# Patient Record
Sex: Female | Born: 1935 | Race: White | Hispanic: No | Smoking: Former smoker
Health system: Southern US, Community
[De-identification: ages and names within clinical notes are randomized; demographics above are authoritative.]

## PROBLEM LIST (undated history)

## (undated) DIAGNOSIS — C801 Malignant (primary) neoplasm, unspecified: Secondary | ICD-10-CM

## (undated) DIAGNOSIS — I499 Cardiac arrhythmia, unspecified: Secondary | ICD-10-CM

## (undated) DIAGNOSIS — I10 Essential (primary) hypertension: Secondary | ICD-10-CM

## (undated) DIAGNOSIS — M1990 Unspecified osteoarthritis, unspecified site: Secondary | ICD-10-CM

## (undated) DIAGNOSIS — K5792 Diverticulitis of intestine, part unspecified, without perforation or abscess without bleeding: Secondary | ICD-10-CM

## (undated) DIAGNOSIS — R0602 Shortness of breath: Secondary | ICD-10-CM

## (undated) DIAGNOSIS — Z8719 Personal history of other diseases of the digestive system: Secondary | ICD-10-CM

## (undated) HISTORY — PX: APPENDECTOMY: SHX54

## (undated) HISTORY — PX: EYE SURGERY: SHX253

## (undated) HISTORY — PX: COLON SURGERY: SHX602

## (undated) HISTORY — PX: COLOSTOMY: SHX63

---

## 2014-08-10 NOTE — Pre-Procedure Instructions (Signed)
[name]  08/10/2014   Your procedure is scheduled on: Friday, August 17, 2014  Report to [facility] Admitting at [place] AM.  Call this number if you have problems the morning of surgery: [phone]   Remember:   Do not eat food or drink liquids after midnight Thursday, Sept. 17, 2015   Take these medicines the morning of surgery with A SIP OF WATER: Metoprolol and Tramadol if needed for pain Stop taking Aspirin,vitamins, and herbal medications. Do not take any NSAIDs ie: Ibuprofen, Advil, Naproxen or any medication containing Aspirin; stop now.  Do not wear jewelry, make-up or nail polish.  Do not wear lotions, powders, or perfumes. You may not wear deodorant.  Do not shave 48 hours prior to surgery.  Do not bring valuables to the hospital.  [facility] is not responsible for any belongings or valuables.               Contacts, dentures or bridgework may not be worn into surgery.  Leave suitcase in the car. After surgery it may be brought to your room.  For patients admitted to the hospital, discharge time is determined by your treatment team.               Patients discharged the day of surgery will not be allowed to drive home.  Name and phone number of your driver:   Special Instructions:  Special Instructions:Special Instructions: [facility] - Preparing for Surgery  Before surgery, you can play an important role.  Because skin is not sterile, your skin needs to be as free of germs as possible.  You can reduce the number of germs on you skin by washing with CHG (chlorahexidine gluconate) soap before surgery.  CHG is an antiseptic cleaner which kills germs and bonds with the skin to continue killing germs even after washing.  Please DO NOT use if you have an allergy to CHG or antibacterial soaps.  If your skin becomes reddened/irritated stop using the CHG and inform your nurse when you arrive at Short Stay.  Do not shave (including legs and underarms) for at least  48 hours prior to the first CHG shower.  You may shave your face.  Please follow these instructions carefully:   1.  Shower with CHG Soap the night before surgery and the morning of Surgery.  2.  If you choose to wash your hair, wash your hair first as usual with your normal shampoo.  3.  After you shampoo, rinse your hair and body thoroughly to remove the Shampoo.  4.  Use CHG as you would any other liquid soap.  You can apply chg directly  to the skin and wash gently with scrungie or a clean washcloth.  5.  Apply the CHG Soap to your body ONLY FROM THE NECK DOWN.  Do not use on open wounds or open sores.  Avoid contact with your eyes, ears, mouth and genitals (private parts).  Wash genitals (private parts) with your normal soap.  6.  Wash thoroughly, paying special attention to the area where your surgery will be performed.  7.  Thoroughly rinse your body with warm water from the neck down.  8.  DO NOT shower/wash with your normal soap after using and rinsing off the CHG Soap.  9.  Pat yourself dry with a clean towel.            10.  Wear clean pajamas.  11.  Place clean sheets on your bed the night of your first shower and do not sleep with pets.  Day of Surgery  Do not apply any lotions/deoderants the morning of surgery.  Please wear clean clothes to the hospital/surgery center.   Please read over the following fact sheets that you were given: Pain Booklet, Coughing and Deep Breathing and Surgical Site Infection Prevention

## 2014-08-13 ENCOUNTER — Encounter (HOSPITAL_COMMUNITY)
Admission: RE | Admit: 2014-08-13 | Discharge: 2014-08-13 | Disposition: A | Discharge status: Home / Self Care | Payer: Medicare PPO | Source: Ambulatory Visit | Attending: Anesthesiology | Admitting: Anesthesiology

## 2014-08-13 ENCOUNTER — Encounter (HOSPITAL_COMMUNITY)
Admission: RE | Admit: 2014-08-13 | Discharge: 2014-08-13 | Disposition: A | Discharge status: Home / Self Care | Payer: Medicare PPO | Source: Ambulatory Visit | Attending: Orthopedic Surgery | Admitting: Orthopedic Surgery

## 2014-08-13 ENCOUNTER — Encounter (HOSPITAL_COMMUNITY): Payer: Self-pay

## 2014-08-13 DIAGNOSIS — M719 Bursopathy, unspecified: Secondary | ICD-10-CM | POA: Diagnosis present

## 2014-08-13 DIAGNOSIS — Z01812 Encounter for preprocedural laboratory examination: Secondary | ICD-10-CM | POA: Diagnosis not present

## 2014-08-13 DIAGNOSIS — Z01810 Encounter for preprocedural cardiovascular examination: Secondary | ICD-10-CM | POA: Diagnosis not present

## 2014-08-13 DIAGNOSIS — M67919 Unspecified disorder of synovium and tendon, unspecified shoulder: Secondary | ICD-10-CM | POA: Insufficient documentation

## 2014-08-13 DIAGNOSIS — M19019 Primary osteoarthritis, unspecified shoulder: Secondary | ICD-10-CM | POA: Insufficient documentation

## 2014-08-13 HISTORY — DX: Malignant (primary) neoplasm, unspecified: C80.1

## 2014-08-13 HISTORY — DX: Diverticulitis of intestine, part unspecified, without perforation or abscess without bleeding: K57.92

## 2014-08-13 HISTORY — DX: Essential (primary) hypertension: I10

## 2014-08-13 HISTORY — DX: Unspecified osteoarthritis, unspecified site: M19.90

## 2014-08-13 HISTORY — DX: Personal history of other diseases of the digestive system: Z87.19

## 2014-08-13 HISTORY — DX: Cardiac arrhythmia, unspecified: I49.9

## 2014-08-13 HISTORY — DX: Shortness of breath: R06.02

## 2014-08-13 LAB — CBC
HCT: 33.2 % — ABNORMAL LOW (ref 36.0–46.0)
Hemoglobin: 11.2 g/dL — ABNORMAL LOW (ref 12.0–15.0)
MCH: 30.1 pg (ref 26.0–34.0)
MCHC: 33.7 g/dL (ref 30.0–36.0)
MCV: 89.2 fL (ref 78.0–100.0)
PLATELETS: 198 10*3/uL (ref 150–400)
RBC: 3.72 MIL/uL — AB (ref 3.87–5.11)
RDW: 12.8 % (ref 11.5–15.5)
WBC: 5.3 10*3/uL (ref 4.0–10.5)

## 2014-08-13 LAB — BASIC METABOLIC PANEL
ANION GAP: 14 (ref 5–15)
BUN: 12 mg/dL (ref 6–23)
CALCIUM: 8.6 mg/dL (ref 8.4–10.5)
CO2: 23 mEq/L (ref 19–32)
Chloride: 101 mEq/L (ref 96–112)
Creatinine, Ser: 0.9 mg/dL (ref 0.50–1.10)
GFR calc Af Amer: 69 mL/min — ABNORMAL LOW (ref >90)
GFR, EST NON AFRICAN AMERICAN: 59 mL/min — AB (ref >90)
Glucose, Bld: 106 mg/dL — ABNORMAL HIGH (ref 70–99)
Potassium: 3.6 mEq/L — ABNORMAL LOW (ref 3.7–5.3)
SODIUM: 138 meq/L (ref 137–147)

## 2014-08-13 NOTE — Progress Notes (Signed)
Called Dr Norris's office for orders for patient's PAT appointment

## 2014-08-14 NOTE — Progress Notes (Addendum)
Anesthesia Chart Review:  Patient is a 80 year old female scheduled for reverse right shoulder arthroplasty on 08/17/14 by Dr. Veverly Taylor.  History includes former smoker, hiatal hernia, arthritis, exertional dyspnea, lymphoma RLE (Dr. Rhona Taylor), dysrhythmia (not specified), diverticulitis s/p colon resection with colostomy s/p takedown, appendectomy. PCP is Dr. Vassie Loll Taylor who signed a note of medical clearance recommending post-operative telemetry.  EKG on 08/13/14 showed: NSR, cannot rule out anterior infarct (age undetermined). Currently, there are no comparison EKGs available.  EKG at [facility] from 2007 was purged.  No stress or echo without three years at [facility].  She reports what sounds like brief perioperative afib with cataract surgery a few years ago in [place].  She did not have to stay overnight, but she was referred to a cardiologist in [place] and reportedly everything checked out okay.  She gets occasional positional lightheadedness with standing, but no syncope, no chest pain, no SOB, no palpitations.  She has chronic LE edema. She has occasional exertional dyspnea when walking up an incline to her daughter trailer that is behind her house.  She says this has been stable for a while now.    CXR on 08/13/14 showed: FINDINGS: There is underlying emphysematous change. There is chronic interstitial fibrotic change in the upper lobes bilaterally and in the left base regions. There is no frank edema or consolidation. The heart size is normal. The pulmonary vascularity reflects underlying emphysematous change. No adenopathy. There is a hiatal hernia present. Aorta is somewhat tortuous. There are multiple compression fractures in the lower thoracic spine. The patient has undergone kyphoplasty procedures at T11 and T12. IMPRESSION: Underlying emphysema with areas of interstitial fibrotic change. No frank edema or consolidation. Moderate hiatal hernia. (She apparently had a chest CT  on 11/07/13, but I cannot currently access that report.  She thought it was done at [facility], but no report found there.)  Preoperative labs noted.   I am awaiting records from [facility]) in [place]. I did review CXR with anesthesiologist Dr. Tamala Taylor and with patient.  She doesn't report any acute pulmonary symptoms, so CXR findings should not interfere with plans for surgery; however, we'll plan to forward report to Dr. Nona Taylor and defer decision for further testing and/or referral to him.    I'll review additional records once received.  [name] [facility] Short Stay Center/Anesthesiology Phone [phone] 08/14/2014 5:58 PM  Addendum:  I called CXR report to nurse [name] with Dr. Nona Taylor.  She did not see any record of a 2014 chest CT.  She said that it looked like patient was seen by cardiology in 2014.  Records are still pending.  [name] [facility] Short Stay Center/Anesthesiology Phone [phone] 08/15/2014 3:02 PM  Addendum: I received cardiology notes from [name] from 12/05/2013.  She was referred for afib/PAF.  According to his note, she a "a longstanding history of intermittent rapid heart rhythm, not severe or sustained.  She had an episode when she was in Dr. Doran Taylor office and was documented to have atrial fibrillation with moderately rapid rate of 120 per minute.  She was put on metoprolol and has had no recurrence..." Due to absence of TIA and increased fall risk no anticoagulation therapy except ASA was recommended.  He recommended PRN cardiology follow-up.  Echo on 11/21/13 showed: Normal LVEF, impaired relaxation. Normal RV size and function. Thickened AV. Velocity across the Valve: 114cm/s. Mild mitral annular calcification. Mild TR.  TR velocity 196 cm/s consistent with normal PAP.   Nuclear stress test on 03/11/11 showed: No evidence of ischemia. Normal wall motion and thickening with EF 65%.    Of  note, anterior and inferior T wave abnormality has improved since her 12/05/13 EKG.  She remains on b-blocker therapy and was maintaining SR at PAT.  She has been cleared by her PCP. If no acute changes then I would anticipate that she could proceed as planned.  I'll update history in Epic to reflect PAF history.  [name] [facility] Short Stay Center/Anesthesiology Phone [phone] 08/16/2014 10:04 AM

## 2014-08-14 NOTE — H&P (Signed)
[name] is an 80 y.o. female.    Chief Complaint: right shoulder pain  HPI: Pt is a 80 y.o. female complaining of right shoulder pain for multiple years. Pain had continually increased since the beginning. X-rays in the clinic show end-stage arthritic changes of the right shoulder. Pt has tried various conservative treatments which have failed to alleviate their symptoms, including therapy and injections. Various options are discussed with the patient. Risks, benefits and expectations were discussed with the patient. Patient understand the risks, benefits and expectations and wishes to proceed with surgery.   PCP:  Townsend Roger, MD  D/C Plans:  Home with HHPT  PMH: Past Medical History  Diagnosis Date  . Dysrhythmia   . Hypertension   . Cancer     lymphoma right leg Dr Earl Gala  . H/O hiatal hernia   . Arthritis   . Diverticulitis   . Shortness of breath     with exertion, get dizzy with deep breathing    PSH: Past Surgical History  Procedure Laterality Date  . Colostomy      due to diverticlitis had colostomy for 1 year  . Colon surgery    . Eye surgery Bilateral     cataracts  . Appendectomy      Social History:  reports that she quit smoking about 59 years ago. Her smoking use included Cigarettes. She smoked 0.00 packs per day. She has never used smokeless tobacco. She reports that she does not drink alcohol or use illicit drugs.  Allergies:  Allergies  Allergen Reactions  . Penicillins Anaphylaxis    Medications: No current facility-administered medications for this encounter.   Current Outpatient Prescriptions  Medication Sig Dispense Refill  . aspirin 81 MG tablet Take 81 mg by mouth daily.      Marland Kitchen CALCIUM-VITAMIN D PO Take by mouth.      . cyclobenzaprine (FLEXERIL) 5 MG tablet Take 5 mg by mouth at bedtime as needed for muscle spasms.      Marland Kitchen ibuprofen (ADVIL,MOTRIN) 200 MG tablet Take 400 mg by mouth daily as needed (pain).      . metoprolol  tartrate (LOPRESSOR) 25 MG tablet Take 12.5 mg by mouth 2 (two) times daily.      . Omega-3 Fatty Acids (FISH OIL PO) Take by mouth.      . traMADol (ULTRAM) 50 MG tablet Take 50 mg by mouth every 4 (four) hours as needed (pain).        Results for orders placed during the hospital encounter of 08/13/14 (from the past 48 hour(s))  BASIC METABOLIC PANEL     Status: Abnormal   Collection Time    08/13/14 10:59 AM      Result Value Ref Range   Sodium 138  137 - 147 mEq/L   Potassium 3.6 (*) 3.7 - 5.3 mEq/L   Chloride 101  96 - 112 mEq/L   CO2 23  19 - 32 mEq/L   Glucose, Bld 106 (*) 70 - 99 mg/dL   BUN 12  6 - 23 mg/dL   Creatinine, Ser 0.90  0.50 - 1.10 mg/dL   Calcium 8.6  8.4 - 10.5 mg/dL   GFR calc non Af Amer 59 (*) >90 mL/min   GFR calc Af Amer 69 (*) >90 mL/min   Comment: (NOTE)     The eGFR has been calculated using the CKD EPI equation.     This calculation has not been validated in all clinical situations.  eGFR's persistently <90 mL/min signify possible Chronic Kidney     Disease.   Anion gap 14  5 - 15  CBC     Status: Abnormal   Collection Time    08/13/14 10:59 AM      Result Value Ref Range   WBC 5.3  4.0 - 10.5 K/uL   RBC 3.72 (*) 3.87 - 5.11 MIL/uL   Hemoglobin 11.2 (*) 12.0 - 15.0 g/dL   HCT 33.2 (*) 36.0 - 46.0 %   MCV 89.2  78.0 - 100.0 fL   MCH 30.1  26.0 - 34.0 pg   MCHC 33.7  30.0 - 36.0 g/dL   RDW 12.8  11.5 - 15.5 %   Platelets 198  150 - 400 K/uL   Dg Chest 2 View  08/13/2014   CLINICAL DATA:  Preoperative shoulder surgery ; shortness of breath ; hypertension  EXAM: CHEST  2 VIEW  COMPARISON:  Chest radiograph August 17, 2011 and chest CT November 07, 2013  FINDINGS: There is underlying emphysematous change. There is chronic interstitial fibrotic change in the upper lobes bilaterally and in the left base regions. There is no frank edema or consolidation. The heart size is normal. The pulmonary vascularity reflects underlying emphysematous change.  No adenopathy. There is a hiatal hernia present. Aorta is somewhat tortuous. There are multiple compression fractures in the lower thoracic spine. The patient has undergone kyphoplasty procedures at T11 and T12.  IMPRESSION: Underlying emphysema with areas of interstitial fibrotic change. No frank edema or consolidation. Moderate hiatal hernia.   Electronically Signed   By: Lowella Grip M.D.   On: 08/13/2014 12:46    ROS: Pain with rom of the right upper extremity  Physical Exam:  Alert and oriented 80 y.o. female in no acute distress Cranial nerves 2-12 intact Cervical spine: full rom with no tenderness, nv intact distally Chest: active breath sounds bilaterally, no wheeze rhonchi or rales Heart: regular rate and rhythm, no murmur Abd: non tender non distended with active bowel sounds Hip is stable with rom  Right shoulder/upper extremity with limited rom due to pain and guarding nv intact distally Strength limited 3.5/5 with ER and IR No rashes  Assessment/Plan Assessment: right shoulder pain secondary to rotator cuff insufficiency  Plan: Patient will undergo a right reverse total shoulder by Dr. Veverly Fells at [facility]. Risks benefits and expectations were discussed with the patient. Patient understand risks, benefits and expectations and wishes to proceed.

## 2014-08-16 ENCOUNTER — Encounter (HOSPITAL_COMMUNITY): Payer: Self-pay

## 2014-08-16 MED ORDER — CLINDAMYCIN PHOSPHATE 900 MG/50ML IV SOLN
900.0000 mg | INTRAVENOUS | Status: DC
  Filled 2014-08-16: qty 50

## 2014-08-17 ENCOUNTER — Inpatient Hospital Stay (HOSPITAL_COMMUNITY): Payer: Medicare PPO

## 2014-08-17 ENCOUNTER — Encounter (HOSPITAL_COMMUNITY)
Admission: RE | Disposition: A | Discharge status: Home / Self Care | Payer: Self-pay | Source: Ambulatory Visit | Attending: Orthopedic Surgery

## 2014-08-17 ENCOUNTER — Inpatient Hospital Stay (HOSPITAL_COMMUNITY)
Admission: RE | Admit: 2014-08-17 | Discharge: 2014-08-18 | DRG: 483 | Disposition: A | Discharge status: Home / Self Care | Payer: Medicare PPO | Source: Ambulatory Visit | Attending: Orthopedic Surgery | Admitting: Orthopedic Surgery

## 2014-08-17 ENCOUNTER — Encounter (HOSPITAL_COMMUNITY): Payer: Medicare PPO | Admitting: Vascular Surgery

## 2014-08-17 ENCOUNTER — Inpatient Hospital Stay (HOSPITAL_COMMUNITY): Payer: Medicare PPO | Admitting: Anesthesiology

## 2014-08-17 ENCOUNTER — Encounter (HOSPITAL_COMMUNITY): Payer: Self-pay | Admitting: Anesthesiology

## 2014-08-17 DIAGNOSIS — Z880 Allergy status to penicillin: Secondary | ICD-10-CM | POA: Diagnosis not present

## 2014-08-17 DIAGNOSIS — S43429A Sprain of unspecified rotator cuff capsule, initial encounter: Secondary | ICD-10-CM | POA: Diagnosis present

## 2014-08-17 DIAGNOSIS — Z7982 Long term (current) use of aspirin: Secondary | ICD-10-CM | POA: Diagnosis not present

## 2014-08-17 DIAGNOSIS — K449 Diaphragmatic hernia without obstruction or gangrene: Secondary | ICD-10-CM | POA: Diagnosis present

## 2014-08-17 DIAGNOSIS — M25519 Pain in unspecified shoulder: Secondary | ICD-10-CM | POA: Diagnosis present

## 2014-08-17 DIAGNOSIS — Z87891 Personal history of nicotine dependence: Secondary | ICD-10-CM

## 2014-08-17 DIAGNOSIS — I10 Essential (primary) hypertension: Secondary | ICD-10-CM | POA: Diagnosis present

## 2014-08-17 DIAGNOSIS — C8589 Other specified types of non-Hodgkin lymphoma, extranodal and solid organ sites: Secondary | ICD-10-CM | POA: Diagnosis present

## 2014-08-17 DIAGNOSIS — M19019 Primary osteoarthritis, unspecified shoulder: Secondary | ICD-10-CM | POA: Diagnosis present

## 2014-08-17 DIAGNOSIS — I4891 Unspecified atrial fibrillation: Secondary | ICD-10-CM | POA: Diagnosis present

## 2014-08-17 HISTORY — PX: REVERSE SHOULDER ARTHROPLASTY: SHX5054

## 2014-08-17 LAB — TYPE AND SCREEN
ABO/RH(D): O POS
Antibody Screen: NEGATIVE

## 2014-08-17 LAB — ABO/RH: ABO/RH(D): O POS

## 2014-08-17 SURGERY — ARTHROPLASTY, SHOULDER, TOTAL, REVERSE
Anesthesia: Regional | Site: Shoulder | Laterality: Right

## 2014-08-17 MED ORDER — BUPIVACAINE-EPINEPHRINE (PF) 0.5% -1:200000 IJ SOLN
INTRAMUSCULAR | Status: AC
  Filled 2014-08-17: qty 30

## 2014-08-17 MED ORDER — ONDANSETRON HCL 4 MG/2ML IJ SOLN
INTRAMUSCULAR | Status: DC | PRN
  Administered 2014-08-17: 4 mg via INTRAVENOUS

## 2014-08-17 MED ORDER — PROPOFOL 10 MG/ML IV BOLUS
INTRAVENOUS | Status: AC
  Filled 2014-08-17: qty 20

## 2014-08-17 MED ORDER — TRAMADOL HCL 50 MG PO TABS
50.0000 mg | ORAL_TABLET | ORAL | Status: DC | PRN
  Administered 2014-08-17: 50 mg via ORAL
  Filled 2014-08-17: qty 1

## 2014-08-17 MED ORDER — PROPOFOL 10 MG/ML IV BOLUS
INTRAVENOUS | Status: DC | PRN
  Administered 2014-08-17: 120 mg via INTRAVENOUS

## 2014-08-17 MED ORDER — OXYCODONE HCL 5 MG PO TABS: 5.0000 mg | ORAL_TABLET | Freq: Once | ORAL | Status: DC | PRN

## 2014-08-17 MED ORDER — CLINDAMYCIN PHOSPHATE 600 MG/50ML IV SOLN
600.0000 mg | Freq: Four times a day (QID) | INTRAVENOUS | Status: AC
  Administered 2014-08-17 – 2014-08-18 (×3): 600 mg via INTRAVENOUS
  Filled 2014-08-17 (×3): qty 50

## 2014-08-17 MED ORDER — BUPIVACAINE-EPINEPHRINE 0.25% -1:200000 IJ SOLN
INTRAMUSCULAR | Status: DC | PRN
  Administered 2014-08-17: 5 mL

## 2014-08-17 MED ORDER — ONDANSETRON HCL 4 MG/2ML IJ SOLN
INTRAMUSCULAR | Status: AC
  Filled 2014-08-17: qty 2

## 2014-08-17 MED ORDER — PHENYLEPHRINE 40 MCG/ML (10ML) SYRINGE FOR IV PUSH (FOR BLOOD PRESSURE SUPPORT)
PREFILLED_SYRINGE | INTRAVENOUS | Status: AC
  Filled 2014-08-17: qty 10

## 2014-08-17 MED ORDER — FENTANYL CITRATE 0.05 MG/ML IJ SOLN
50.0000 ug | Freq: Once | INTRAMUSCULAR | Status: AC
  Administered 2014-08-17: 100 ug via INTRAVENOUS

## 2014-08-17 MED ORDER — MENTHOL 3 MG MT LOZG: 1.0000 | LOZENGE | OROMUCOSAL | Status: DC | PRN

## 2014-08-17 MED ORDER — SODIUM CHLORIDE 0.9 % IJ SOLN
INTRAMUSCULAR | Status: AC
  Filled 2014-08-17: qty 10

## 2014-08-17 MED ORDER — DEXAMETHASONE SODIUM PHOSPHATE 4 MG/ML IJ SOLN
INTRAMUSCULAR | Status: DC | PRN
  Administered 2014-08-17: 4 mg via INTRAVENOUS

## 2014-08-17 MED ORDER — PHENYLEPHRINE HCL 10 MG/ML IJ SOLN
INTRAMUSCULAR | Status: DC | PRN
  Administered 2014-08-17 (×12): 40 ug via INTRAVENOUS

## 2014-08-17 MED ORDER — SODIUM CHLORIDE 0.9 % IR SOLN
Status: DC | PRN
Start: 2014-08-17 — End: 2014-08-17
  Administered 2014-08-17: 1000 mL

## 2014-08-17 MED ORDER — METHOCARBAMOL 500 MG PO TABS
500.0000 mg | ORAL_TABLET | Freq: Four times a day (QID) | ORAL | Status: DC | PRN
  Administered 2014-08-18: 500 mg via ORAL
  Filled 2014-08-17 (×2): qty 1

## 2014-08-17 MED ORDER — ONDANSETRON HCL 4 MG/2ML IJ SOLN: 4.0000 mg | Freq: Four times a day (QID) | INTRAMUSCULAR | Status: DC | PRN

## 2014-08-17 MED ORDER — ACETAMINOPHEN 325 MG PO TABS: 650.0000 mg | ORAL_TABLET | Freq: Four times a day (QID) | ORAL | Status: DC | PRN

## 2014-08-17 MED ORDER — METOCLOPRAMIDE HCL 5 MG/ML IJ SOLN: 5.0000 mg | Freq: Three times a day (TID) | INTRAMUSCULAR | Status: DC | PRN

## 2014-08-17 MED ORDER — BUPIVACAINE-EPINEPHRINE (PF) 0.5% -1:200000 IJ SOLN
INTRAMUSCULAR | Status: DC | PRN
  Administered 2014-08-17: 30 mL via PERINEURAL

## 2014-08-17 MED ORDER — ASPIRIN 81 MG PO CHEW
81.0000 mg | CHEWABLE_TABLET | Freq: Every day | ORAL | Status: DC
  Administered 2014-08-18: 81 mg via ORAL
  Filled 2014-08-17: qty 1

## 2014-08-17 MED ORDER — METOCLOPRAMIDE HCL 10 MG PO TABS: 5.0000 mg | ORAL_TABLET | Freq: Three times a day (TID) | ORAL | Status: DC | PRN

## 2014-08-17 MED ORDER — FENTANYL CITRATE 0.05 MG/ML IJ SOLN
INTRAMUSCULAR | Status: AC
  Administered 2014-08-17: 100 ug via INTRAVENOUS
  Filled 2014-08-17: qty 2

## 2014-08-17 MED ORDER — FENTANYL CITRATE 0.05 MG/ML IJ SOLN
INTRAMUSCULAR | Status: DC | PRN
  Administered 2014-08-17: 25 ug via INTRAVENOUS

## 2014-08-17 MED ORDER — ROCURONIUM BROMIDE 100 MG/10ML IV SOLN
INTRAVENOUS | Status: DC | PRN
  Administered 2014-08-17: 30 mg via INTRAVENOUS

## 2014-08-17 MED ORDER — LACTATED RINGERS IV SOLN
INTRAVENOUS | Status: DC | PRN
  Administered 2014-08-17 (×2): via INTRAVENOUS

## 2014-08-17 MED ORDER — EPHEDRINE SULFATE 50 MG/ML IJ SOLN
INTRAMUSCULAR | Status: AC
  Filled 2014-08-17: qty 1

## 2014-08-17 MED ORDER — SODIUM CHLORIDE 0.9 % IV SOLN
INTRAVENOUS | Status: DC
Start: 2014-08-17 — End: 2014-08-18

## 2014-08-17 MED ORDER — STERILE WATER FOR IRRIGATION IR SOLN
Status: DC | PRN
  Administered 2014-08-17: 1000 mL

## 2014-08-17 MED ORDER — CYCLOBENZAPRINE HCL 10 MG PO TABS
5.0000 mg | ORAL_TABLET | Freq: Every evening | ORAL | Status: DC | PRN
  Administered 2014-08-17: 5 mg via ORAL
  Filled 2014-08-17: qty 1

## 2014-08-17 MED ORDER — CHLORHEXIDINE GLUCONATE 4 % EX LIQD: 60.0000 mL | Freq: Once | CUTANEOUS | Status: DC

## 2014-08-17 MED ORDER — METOPROLOL TARTRATE 12.5 MG HALF TABLET
12.5000 mg | ORAL_TABLET | Freq: Two times a day (BID) | ORAL | Status: DC
  Administered 2014-08-17 – 2014-08-18 (×2): 12.5 mg via ORAL
  Filled 2014-08-17 (×4): qty 1

## 2014-08-17 MED ORDER — ROCURONIUM BROMIDE 50 MG/5ML IV SOLN
INTRAVENOUS | Status: AC
  Filled 2014-08-17: qty 1

## 2014-08-17 MED ORDER — OXYCODONE-ACETAMINOPHEN 5-325 MG PO TABS: 1.0000 | ORAL_TABLET | ORAL | Status: DC | PRN

## 2014-08-17 MED ORDER — SUCCINYLCHOLINE CHLORIDE 20 MG/ML IJ SOLN
INTRAMUSCULAR | Status: AC
  Filled 2014-08-17: qty 1

## 2014-08-17 MED ORDER — NEOSTIGMINE METHYLSULFATE 10 MG/10ML IV SOLN
INTRAVENOUS | Status: DC | PRN
  Administered 2014-08-17: 3 mg via INTRAVENOUS

## 2014-08-17 MED ORDER — ACETAMINOPHEN 650 MG RE SUPP: 650.0000 mg | Freq: Four times a day (QID) | RECTAL | Status: DC | PRN

## 2014-08-17 MED ORDER — METHOCARBAMOL 1000 MG/10ML IJ SOLN
500.0000 mg | Freq: Four times a day (QID) | INTRAMUSCULAR | Status: DC | PRN
  Filled 2014-08-17: qty 5

## 2014-08-17 MED ORDER — ONDANSETRON HCL 4 MG PO TABS: 4.0000 mg | ORAL_TABLET | Freq: Four times a day (QID) | ORAL | Status: DC | PRN

## 2014-08-17 MED ORDER — ARTIFICIAL TEARS OP OINT
TOPICAL_OINTMENT | OPHTHALMIC | Status: AC
  Filled 2014-08-17: qty 3.5

## 2014-08-17 MED ORDER — PHENOL 1.4 % MT LIQD: 1.0000 | OROMUCOSAL | Status: DC | PRN

## 2014-08-17 MED ORDER — GLYCOPYRROLATE 0.2 MG/ML IJ SOLN
INTRAMUSCULAR | Status: DC | PRN
  Administered 2014-08-17: 0.4 mg via INTRAVENOUS

## 2014-08-17 MED ORDER — GLYCOPYRROLATE 0.2 MG/ML IJ SOLN
INTRAMUSCULAR | Status: AC
  Filled 2014-08-17: qty 2

## 2014-08-17 MED ORDER — MORPHINE SULFATE 2 MG/ML IJ SOLN: 2.0000 mg | INTRAMUSCULAR | Status: DC | PRN

## 2014-08-17 MED ORDER — NEOSTIGMINE METHYLSULFATE 10 MG/10ML IV SOLN
INTRAVENOUS | Status: AC
  Filled 2014-08-17: qty 1

## 2014-08-17 MED ORDER — LIDOCAINE HCL (CARDIAC) 20 MG/ML IV SOLN
INTRAVENOUS | Status: DC | PRN
  Administered 2014-08-17: 60 mg via INTRAVENOUS

## 2014-08-17 MED ORDER — METHOCARBAMOL 500 MG PO TABS: 500.0000 mg | ORAL_TABLET | Freq: Three times a day (TID) | ORAL | Status: DC | PRN

## 2014-08-17 MED ORDER — OXYCODONE HCL 5 MG/5ML PO SOLN: 5.0000 mg | Freq: Once | ORAL | Status: DC | PRN

## 2014-08-17 MED ORDER — LACTATED RINGERS IV SOLN
INTRAVENOUS | Status: DC
  Administered 2014-08-17: 09:00:00 via INTRAVENOUS

## 2014-08-17 MED ORDER — FENTANYL CITRATE 0.05 MG/ML IJ SOLN: 25.0000 ug | INTRAMUSCULAR | Status: DC | PRN

## 2014-08-17 MED ORDER — OXYCODONE-ACETAMINOPHEN 5-325 MG PO TABS
1.0000 | ORAL_TABLET | ORAL | Status: DC | PRN
  Administered 2014-08-18: 2 via ORAL
  Filled 2014-08-17: qty 2

## 2014-08-17 MED ORDER — FENTANYL CITRATE 0.05 MG/ML IJ SOLN
INTRAMUSCULAR | Status: AC
  Filled 2014-08-17: qty 5

## 2014-08-17 MED ORDER — ASPIRIN 81 MG PO TABS: 81.0000 mg | ORAL_TABLET | Freq: Every day | ORAL | Status: DC

## 2014-08-17 MED ORDER — CLINDAMYCIN PHOSPHATE 900 MG/50ML IV SOLN
INTRAVENOUS | Status: DC | PRN
  Administered 2014-08-17: 900 mg via INTRAVENOUS

## 2014-08-17 SURGICAL SUPPLY — 66 items
BIT DRILL 170X2.5X (BIT) IMPLANT
BIT DRL 170X2.5X (BIT)
BLADE SAG 18X100X1.27 (BLADE) ×3 IMPLANT
CAPT SHOULD DELTAXTEND CEM MOD ×3 IMPLANT
CLOSURE WOUND 1/2 X4 (GAUZE/BANDAGES/DRESSINGS) ×1
COVER SURGICAL LIGHT HANDLE (MISCELLANEOUS) ×3 IMPLANT
DRAPE INCISE IOBAN 66X45 STRL (DRAPES) ×3 IMPLANT
DRAPE U-SHAPE 47X51 STRL (DRAPES) ×3 IMPLANT
DRAPE X-RAY CASS 24X20 (DRAPES) IMPLANT
DRILL 2.5 (BIT)
DRSG ADAPTIC 3X8 NADH LF (GAUZE/BANDAGES/DRESSINGS) ×3 IMPLANT
DRSG PAD ABDOMINAL 8X10 ST (GAUZE/BANDAGES/DRESSINGS) ×3 IMPLANT
DURAPREP 26ML APPLICATOR (WOUND CARE) ×3 IMPLANT
ELECT BLADE 4.0 EZ CLEAN MEGAD (MISCELLANEOUS) ×3
ELECT NEEDLE TIP 2.8 STRL (NEEDLE) ×3 IMPLANT
ELECT REM PT RETURN 9FT ADLT (ELECTROSURGICAL) ×3
ELECTRODE BLDE 4.0 EZ CLN MEGD (MISCELLANEOUS) ×1 IMPLANT
ELECTRODE REM PT RTRN 9FT ADLT (ELECTROSURGICAL) ×1 IMPLANT
GAUZE SPONGE 4X4 12PLY STRL (GAUZE/BANDAGES/DRESSINGS) ×3 IMPLANT
GLOVE BIOGEL PI ORTHO PRO 7.5 (GLOVE) ×2
GLOVE BIOGEL PI ORTHO PRO SZ8 (GLOVE) ×2
GLOVE ORTHO TXT STRL SZ7.5 (GLOVE) ×3 IMPLANT
GLOVE PI ORTHO PRO STRL 7.5 (GLOVE) ×1 IMPLANT
GLOVE PI ORTHO PRO STRL SZ8 (GLOVE) ×1 IMPLANT
GLOVE SURG ORTHO 8.5 STRL (GLOVE) ×3 IMPLANT
GOWN STRL REUS W/ TWL LRG LVL3 (GOWN DISPOSABLE) ×1 IMPLANT
GOWN STRL REUS W/ TWL XL LVL3 (GOWN DISPOSABLE) ×2 IMPLANT
GOWN STRL REUS W/TWL LRG LVL3 (GOWN DISPOSABLE) ×2
GOWN STRL REUS W/TWL XL LVL3 (GOWN DISPOSABLE) ×4
HANDPIECE INTERPULSE COAX TIP (DISPOSABLE)
KIT BASIN OR (CUSTOM PROCEDURE TRAY) ×3 IMPLANT
KIT ROOM TURNOVER OR (KITS) ×3 IMPLANT
MANIFOLD NEPTUNE II (INSTRUMENTS) ×3 IMPLANT
NEEDLE 1/2 CIR MAYO (NEEDLE) ×3 IMPLANT
NEEDLE HYPO 25GX1X1/2 BEV (NEEDLE) ×3 IMPLANT
NS IRRIG 1000ML POUR BTL (IV SOLUTION) ×3 IMPLANT
PACK SHOULDER (CUSTOM PROCEDURE TRAY) ×3 IMPLANT
PAD ABD 8X10 STRL (GAUZE/BANDAGES/DRESSINGS) ×3 IMPLANT
PAD ARMBOARD 7.5X6 YLW CONV (MISCELLANEOUS) ×6 IMPLANT
PIN GUIDE 1.2 (PIN) IMPLANT
PIN GUIDE GLENOPHERE 1.5MX300M (PIN) IMPLANT
PIN METAGLENE 2.5 (PIN) IMPLANT
SET HNDPC FAN SPRY TIP SCT (DISPOSABLE) IMPLANT
SLING ARM LRG ADULT FOAM STRAP (SOFTGOODS) IMPLANT
SLING ARM MED ADULT FOAM STRAP (SOFTGOODS) ×3 IMPLANT
SPONGE GAUZE 4X4 12PLY STER LF (GAUZE/BANDAGES/DRESSINGS) ×3 IMPLANT
SPONGE LAP 18X18 X RAY DECT (DISPOSABLE) ×3 IMPLANT
SPONGE LAP 4X18 X RAY DECT (DISPOSABLE) ×3 IMPLANT
STRIP CLOSURE SKIN 1/2X4 (GAUZE/BANDAGES/DRESSINGS) ×2 IMPLANT
SUCTION FRAZIER TIP 10 FR DISP (SUCTIONS) ×3 IMPLANT
SUT FIBERWIRE #2 38 T-5 BLUE (SUTURE) ×6
SUT MNCRL AB 4-0 PS2 18 (SUTURE) ×3 IMPLANT
SUT VIC AB 0 CT1 27 (SUTURE) ×2
SUT VIC AB 0 CT1 27XBRD ANBCTR (SUTURE) ×1 IMPLANT
SUT VIC AB 2-0 CT1 27 (SUTURE) ×2
SUT VIC AB 2-0 CT1 TAPERPNT 27 (SUTURE) ×1 IMPLANT
SUT VICRYL 0 CT 1 36IN (SUTURE) ×3 IMPLANT
SUTURE FIBERWR #2 38 T-5 BLUE (SUTURE) ×2 IMPLANT
SYR CONTROL 10ML LL (SYRINGE) ×3 IMPLANT
TAPE CLOTH SURG 6X10 WHT LF (GAUZE/BANDAGES/DRESSINGS) ×3 IMPLANT
TOWEL OR 17X24 6PK STRL BLUE (TOWEL DISPOSABLE) ×3 IMPLANT
TOWEL OR 17X26 10 PK STRL BLUE (TOWEL DISPOSABLE) ×3 IMPLANT
TOWER CARTRIDGE SMART MIX (DISPOSABLE) IMPLANT
TRAY FOLEY CATH 16FRSI W/METER (SET/KITS/TRAYS/PACK) IMPLANT
WATER STERILE IRR 1000ML POUR (IV SOLUTION) ×3 IMPLANT
YANKAUER SUCT BULB TIP NO VENT (SUCTIONS) IMPLANT

## 2014-08-17 NOTE — Anesthesia Preprocedure Evaluation (Signed)
Anesthesia Evaluation  Patient identified by MRN, date of birth, ID band Patient awake    Reviewed: Allergy & Precautions, H&P , NPO status , Patient's Chart, lab work & pertinent test results  Airway Mallampati: II  Neck ROM: full    Dental   Pulmonary shortness of breath, former smoker,          Cardiovascular hypertension, + dysrhythmias Atrial Fibrillation  PAF   Neuro/Psych    GI/Hepatic hiatal hernia,   Endo/Other    Renal/GU      Musculoskeletal  (+) Arthritis -,   Abdominal   Peds  Hematology   Anesthesia Other Findings   Reproductive/Obstetrics                           Anesthesia Physical Anesthesia Plan  ASA: II  Anesthesia Plan: General and Regional   Post-op Pain Management: MAC Combined w/ Regional for Post-op pain   Induction: Intravenous  Airway Management Planned: Oral ETT  Additional Equipment:   Intra-op Plan:   Post-operative Plan: Extubation in OR  Informed Consent: I have reviewed the patients History and Physical, chart, labs and discussed the procedure including the risks, benefits and alternatives for the proposed anesthesia with the patient or authorized representative who has indicated his/her understanding and acceptance.     Plan Discussed with: CRNA, Anesthesiologist and Surgeon  Anesthesia Plan Comments:         Anesthesia Quick Evaluation

## 2014-08-17 NOTE — Anesthesia Procedure Notes (Signed)
Anesthesia Regional Block:  Interscalene brachial plexus block  Pre-Anesthetic Checklist: ,, timeout performed, Correct Patient, Correct Site, Correct Laterality, Correct Procedure, Correct Position, site marked, Risks and benefits discussed,  Surgical consent,  Pre-op evaluation,  At surgeon's request and post-op pain management  Laterality: Right  Prep: chloraprep       Needles:  Injection technique: Single-shot  Needle Type: Echogenic Stimulator Needle     Needle Length: 5cm 5 cm Needle Gauge: 22 and 22 G    Additional Needles:  Procedures: ultrasound guided (picture in chart) and nerve stimulator Interscalene brachial plexus block  Nerve Stimulator or Paresthesia:  Response: biceps flexion, 0.45 mA,   Additional Responses:   Narrative:  Start time: 08/17/2014 9:02 AM End time: 08/17/2014 9:16 AM Injection made incrementally with aspirations every 5 mL.  Performed by: Personally  Anesthesiologist: Dr Marcie Bal  Additional Notes: Functioning IV was confirmed and monitors were applied.  A 52mm 22ga Arrow echogenic stimulator needle was used. Sterile prep and drape,hand hygiene and sterile gloves were used.  Negative aspiration and negative test dose prior to incremental administration of local anesthetic. The patient tolerated the procedure well.  Ultrasound guidance: relevent anatomy identified, needle position confirmed, local anesthetic spread visualized around nerve(s), vascular puncture avoided.  Image printed for medical record.

## 2014-08-17 NOTE — Discharge Instructions (Signed)
Ice to the shoulder at all times. May remove the sling when you feel comfortable.  Use the arm as you can for daily activities  Minimize lifting pushing and pulling  Keep the incision clean and dry for one week, then ok to shower and get the wound wet.  Follow up in two weeks with Dr Veverly Fells [phone]

## 2014-08-17 NOTE — Plan of Care (Signed)
Problem: Consults Goal: Diagnosis- Total Joint Replacement Reverse Total Shoulder

## 2014-08-17 NOTE — Brief Op Note (Signed)
08/17/2014  11:23 AM  PATIENT:  [name]  80 y.o. female  PRE-OPERATIVE DIAGNOSIS:  right shoulder rotator cuff tear arthropathy  POST-OPERATIVE DIAGNOSIS:  right shoulder rotator cuff tear arthropathy  PROCEDURE:  Procedure(s): REVERSE RIGHT SHOULDER ARTHROPLASTY (Right) DePuy Delta Xtend  SURGEON:  Surgeon(s) and Role:    * Augustin Schooling, MD - Primary  PHYSICIAN ASSISTANT:   ASSISTANTS: Ventura Bruns, PA-C   ANESTHESIA:   regional and general  EBL:  Total I/O In: 1100 [I.V.:1100] Out: 150 [Blood:150]  BLOOD ADMINISTERED:none  DRAINS: none   LOCAL MEDICATIONS USED:  MARCAINE     SPECIMEN:  No Specimen  DISPOSITION OF SPECIMEN:  N/A  COUNTS:  YES  TOURNIQUET:  * No tourniquets in log *  DICTATION: .Other Dictation: Dictation Number [id number]  PLAN OF CARE: Admit to inpatient   PATIENT DISPOSITION:  PACU - hemodynamically stable.   Delay start of Pharmacological VTE agent (>24hrs) due to surgical blood loss or risk of bleeding: no

## 2014-08-17 NOTE — Progress Notes (Signed)
Pt to OR.

## 2014-08-17 NOTE — Transfer of Care (Signed)
Immediate Anesthesia Transfer of Care Note  Patient: [name]  Procedure(s) Performed: Procedure(s): REVERSE RIGHT SHOULDER ARTHROPLASTY (Right)  Patient Location: PACU  Anesthesia Type:GA combined with regional for post-op pain  Level of Consciousness: awake, alert  and oriented  Airway & Oxygen Therapy: Patient Spontanous Breathing and Patient connected to face mask oxygen  Post-op Assessment: Report given to PACU RN  Post vital signs: Reviewed and stable  Complications: No apparent anesthesia complications

## 2014-08-17 NOTE — Op Note (Signed)
[name], [name] NO.:  [id number]  MEDICAL RECORD NO.:  [id number]  LOCATION:  [facility]                         FACILITY:  [facility]  PHYSICIAN:  Doran Heater. Veverly Fells, M.D. DATE OF BIRTH:  05/04/1933  DATE OF PROCEDURE:  08/17/2014 DATE OF DISCHARGE:                              OPERATIVE REPORT   PREOPERATIVE DIAGNOSIS:  Right shoulder rotator cuff tear arthropathy.  POSTOPERATIVE DIAGNOSIS:  Right shoulder rotator cuff tear arthropathy.  PROCEDURE PERFORMED:  Right reverse total shoulder arthroplasty and DePuy Delta Xtend prosthesis.  ATTENDING SURGEON:  Doran Heater. Veverly Fells, M.D.  ASSISTANT:  Abbott Pao. Dixon, P.A. who was scrubbed the entire procedure and necessary for satisfactory completion of surgery.  ANESTHESIA:  General anesthesia was used plus interscalene block.  ESTIMATED BLOOD LOSS:  200 mL.  FLUID REPLACED:  1500 mL of crystalloid.  COUNTS:  Instrument counts were correct.  COMPLICATIONS:  There were no complications.  ANTIBIOTICS:  Perioperative antibiotics were given.  INDICATIONS:  The patient is a 80 year old female with worsening right shoulder pain and loss of function secondary to rotator cuff tear arthropathy.  The patient has failed all measures of conservative management.  Desires total shoulder arthroplasty, reverse to restore range of motion and function to her shoulder and eliminate pain. Informed consent obtained.  DESCRIPTION OF PROCEDURE:  After an adequate level of anesthesia was achieved, the patient was positioned in the modified beach chair position.  Right shoulder correctly identified.  Time-out was called. Sterile prep and drape of the shoulder and arm performed.  We entered the shoulder using standard deltopectoral incision, started at the coracoid process extending down to the anterior humeral shaft.  The cephalic vein was identified, taken laterally with the deltoid. Pectoralis taken medially.  The conjoined tendon was  identified and retracted.  There was no subscapularis present and actually the capsule was basically scarred into the conjoined tendon and that had to be gently dissected away, and we basically performed an anterior capsulectomy.  We also released the capsule off of the inferior humerus. There was no rotator cuff present superiorly and posteriorly as well. Once we exposed the humeral head and delivered the wound, we placed a reamer in the proximal humerus and reamed the intramedullary shaft up to size 12.  We then placed the metaphyseal resection guide off the 12 shaft diameter, and then we went ahead and reamed for the epi to right for the metaphysis and reamed down to the appropriate depth.  Next, we went ahead and introduced our trial component, which was going to be a press-fit 12 body with an epi to right metaphyseal modular component. Once that was impacted into place, we went ahead and reduced the shoulder posteriorly, performed a 360-degree glenoid labral removal and also capsular removal inferiorly such we see the glenoid well.  We then went ahead and removed the remaining cartilage off the glenoid with a Cobb elevator, used our guide to drill the center point for the metaglene preparation.  We then reamed for the metaglene and then drilled out the central peg hole, impacted the metaglene into position. We then went ahead and placed a 36 inferior screw and 30 up into the  coracoid base, and then then 18 nonlocked anteriorly, could not get 1 posteriorly due to potential for fracture posteriorly.  We had great purchase with the screws and then secured metaglene.  We then placed our 38 standard glenosphere into position.  Screwed had been place.  Checked to make sure the axillary nerve was out of the way, and we then went ahead and reduced the shoulder with a 38 +6 poly.  Next, we went ahead and ranged the shoulder.  We had nice tight shoulder, but did not think we could get a +9 in  place.  We then removed the trial components from the humerus, irrigated thoroughly humeral shaft and then used impaction grafting technique with available bone graft from the humeral head to impact the size 12 epi to right HA-coated humeral stem into position for the Delta reverse DePuy shoulder.  We then went ahead and selected the 38 +9 poly, impacted that in position, reduced the shoulder, and placed a soft tissue balancing and a nice tight conjoined, also no gapping with external rotation and negative sulcus.  We checked the axillary nerve, it was not under too much tension.  Thoroughly irrigated and repaired the deltopectoral interval with 0 Vicryl suture followed by 2-0 Vicryl subcutaneous closure and 4-0 Monocryl for skin.  Steri-Strips applied followed by sterile dressing and shoulder sling.  The patient tolerated the surgery well.     Doran Heater. Veverly Fells, M.D.     SRN/MEDQ  D:  08/17/2014  T:  08/17/2014  Job:  [id number]

## 2014-08-17 NOTE — Interval H&P Note (Signed)
History and Physical Interval Note:  08/17/2014 9:22 AM  [name]  has presented today for surgery, with the diagnosis of right shoulder rotator cuff tear arthropathy  The various methods of treatment have been discussed with the patient and family. After consideration of risks, benefits and other options for treatment, the patient has consented to  Procedure(s): REVERSE RIGHT SHOULDER ARTHROPLASTY (Right) as a surgical intervention .  The patient's history has been reviewed, patient examined, no change in status, stable for surgery.  I have reviewed the patient's chart and labs.  Questions were answered to the patient's satisfaction.     Becky Colan,STEVEN R

## 2014-08-18 LAB — HEMOGLOBIN AND HEMATOCRIT, BLOOD
HCT: 27.1 % — ABNORMAL LOW (ref 36.0–46.0)
HEMOGLOBIN: 9.4 g/dL — AB (ref 12.0–15.0)

## 2014-08-18 LAB — BASIC METABOLIC PANEL
Anion gap: 10 (ref 5–15)
BUN: 11 mg/dL (ref 6–23)
CO2: 27 meq/L (ref 19–32)
Calcium: 8.5 mg/dL (ref 8.4–10.5)
Chloride: 101 mEq/L (ref 96–112)
Creatinine, Ser: 0.8 mg/dL (ref 0.50–1.10)
GFR calc Af Amer: 79 mL/min — ABNORMAL LOW (ref >90)
GFR calc non Af Amer: 68 mL/min — ABNORMAL LOW (ref >90)
Glucose, Bld: 110 mg/dL — ABNORMAL HIGH (ref 70–99)
Potassium: 3.8 mEq/L (ref 3.7–5.3)
Sodium: 138 mEq/L (ref 137–147)

## 2014-08-18 NOTE — Progress Notes (Signed)
    Subjective: 1 Day Post-Op Procedure(s) (LRB): REVERSE RIGHT SHOULDER ARTHROPLASTY (Right) Patient reports pain as 2 on 0-10 scale.   Denies CP or SOB.  Voiding without difficulty. Positive flatus. Objective: Vital signs in last 24 hours: Temp:  [97.6 F (36.4 C)-98.1 F (36.7 C)] 98.1 F (36.7 C) (09/19 0547) Pulse Rate:  [41-89] 78 (09/19 0547) Resp:  [9-22] 16 (09/18 1953) BP: (102-145)/(60-81) 102/60 mmHg (09/19 0547) SpO2:  [84 %-100 %] 99 % (09/19 0547)  Intake/Output from previous day: 09/18 0701 - 09/19 0700 In: 1900 [P.O.:360; I.V.:1540] Out: 150 [Blood:150] Intake/Output this shift:    Labs:  Recent Labs  08/18/14 0526  HGB 9.4*    Recent Labs  08/18/14 0526  HCT 27.1*    Recent Labs  08/18/14 0526  NA 138  K 3.8  CL 101  CO2 27  BUN 11  CREATININE 0.80  GLUCOSE 110*  CALCIUM 8.5   No results found for this basename: LABPT, INR,  in the last 72 hours  Physical Exam: Neurologically intact Neurovascular intact Incision: dressing C/D/I Compartment soft  Assessment/Plan: 1 Day Post-Op Procedure(s) (LRB): REVERSE RIGHT SHOULDER ARTHROPLASTY (Right) Up with therapy Plan on d/c today  Laquasia Pincus D for Dr. Melina Schools [facility] [phone] 08/18/2014, 9:10 AM

## 2014-08-18 NOTE — Progress Notes (Signed)
Occupational Therapy Evaluation and Discharge Patient Details Name: [name] MRN: [id number] DOB: 04-15-1933 Today's Date: 08/18/2014    History of Present Illness [name] is a 80 y.o. female s/p Rt reverse TSA.    Clinical Impression   PTA pt lived alone and was independent with ADLs and IADLs. Pt requires assistance for UB dressing and bathing due to ROM limitations and caregiver demonstrated independence with assisting pt. Education and training completed for compensatory techniques for ADLs and therapeutic exercises. Per MD note, wishes pt to have HHPT. No further acute OT needs at this time.     Follow Up Recommendations  Other (comment);Supervision/Assistance - 24 hour;No OT follow up (HHPT per MD order)    Equipment Recommendations  None recommended by OT    Recommendations for Other Services       Precautions / Restrictions Precautions Precautions: Shoulder;Fall Type of Shoulder Precautions: No shoulder ROM; Norris Protocol Shoulder Interventions: Shoulder sling/immobilizer;For comfort Precaution Booklet Issued: Yes (comment) Precaution Comments: Educated pt on precautions, exercises, and incorporating into ADLs Required Braces or Orthoses: Sling Restrictions Weight Bearing Restrictions: Yes RUE Weight Bearing: Non weight bearing      Mobility Bed Mobility Overal bed mobility: Modified Independent             General bed mobility comments: VC's to not WB through Rt UE initally  Transfers Overall transfer level: Needs assistance Equipment used: None Transfers: Sit to/from Stand Sit to Stand: Min guard         General transfer comment: Min guard for safety    Balance Overall balance assessment: No apparent balance deficits (not formally assessed)                                          ADL Overall ADL's : Needs assistance/impaired Eating/Feeding: Set up;Sitting Eating/Feeding Details (indicate cue type and reason): pt  needs assistance cutting food  Grooming: Oral care;Supervision/safety;Standing   Upper Body Bathing: Minimal assitance;Sitting Upper Body Bathing Details (indicate cue type and reason): demonstrated gravity-assisted method to bathe under Rt UE Lower Body Bathing: Min guard;Sit to/from stand   Upper Body Dressing : Moderate assistance;Sitting;With caregiver independent assisting (including sling) Upper Body Dressing Details (indicate cue type and reason): compensatory techniques demonstrated and practiced Lower Body Dressing: Min guard;Sit to/from stand   Toilet Transfer: Min guard;Ambulation           Functional mobility during ADLs: Min guard       Vision  Pt reports no change from baseline. No apparent deficits.                    Perception Perception Perception Tested?: No   Praxis Praxis Praxis tested?: Within functional limits    Pertinent Vitals/Pain Pain Assessment: No/denies pain     Hand Dominance Right   Extremity/Trunk Assessment Upper Extremity Assessment Upper Extremity Assessment: RUE deficits/detail RUE Deficits / Details: no shoulder ROM; Norris Protocol RUE: Unable to fully assess due to immobilization RUE Sensation: decreased light touch (due to nerve block) RUE Coordination: decreased gross motor   Lower Extremity Assessment Lower Extremity Assessment: Overall WFL for tasks assessed   Cervical / Trunk Assessment Cervical / Trunk Assessment: Kyphotic (head forward)   Communication Communication Communication: No difficulties   Cognition Arousal/Alertness: Awake/alert Behavior During Therapy: WFL for tasks assessed/performed Overall Cognitive Status: Within Functional Limits for tasks assessed  Memory: Decreased short-term memory                Exercises Exercises: Shoulder     Shoulder Instructions Shoulder Instructions Donning/doffing shirt without moving shoulder: Moderate assistance;Caregiver independent with  task Method for sponge bathing under operated UE: Minimal assistance;Caregiver independent with task Donning/doffing sling/immobilizer: Moderate assistance;Caregiver independent with task Correct positioning of sling/immobilizer: Supervision/safety Pendulum exercises (written home exercise program):  (pt unable to complete safely despite many attempts at educat) ROM for elbow, wrist and digits of operated UE: Supervision/safety Sling wearing schedule (on at all times/off for ADL's): Supervision/safety Proper positioning of operated UE when showering: Supervision/safety Positioning of UE while sleeping: [place] expects to be discharged to:: Private residence Living Arrangements: Alone Available Help at Discharge: Family;Available 24 hours/day (daughter staying with her) Type of Home: Mobile home Home Access: Stairs to enter Entrance Stairs-Number of Steps: ~5   Home Layout: One level     Bathroom Shower/Tub: Other (comment);Tub/shower unit (pt sponge bathes) Shower/tub characteristics: Architectural technologist: Standard     Home Equipment: None          Prior Functioning/Environment Level of Independence: Independent                                       End of Session Equipment Utilized During Treatment: Other (comment) (sling) Nurse Communication: Other (comment) (pt ready for d/c from OT standpoint)  Activity Tolerance: Patient tolerated treatment well Patient left: in chair;with family/visitor present;with call bell/phone within reach   Time: 6213-0865 OT Time Calculation (min): 55 min Charges:  OT General Charges $OT Visit: 1 Procedure OT Evaluation $Initial OT Evaluation Tier I: 1 Procedure OT Treatments $Self Care/Home Management : 23-37 mins $Therapeutic Exercise: 8-22 mins  [name] [phone] 08/18/2014, 10:54 AM

## 2014-08-18 NOTE — Progress Notes (Signed)
Discharge instructions given. Pt verbalized understanding and all questions were answered.  

## 2014-08-20 ENCOUNTER — Encounter (HOSPITAL_COMMUNITY): Payer: Self-pay | Admitting: Orthopedic Surgery

## 2014-08-20 NOTE — Anesthesia Postprocedure Evaluation (Signed)
Anesthesia Post Note  Patient: [name]  Procedure(s) Performed: Procedure(s) (LRB): REVERSE RIGHT SHOULDER ARTHROPLASTY (Right)  Anesthesia type: General  Patient location: PACU  Post pain: Pain level controlled and Adequate analgesia  Post assessment: Post-op Vital signs reviewed, Patient's Cardiovascular Status Stable, Respiratory Function Stable, Patent Airway and Pain level controlled  Last Vitals:  Filed Vitals:   08/18/14 0547  BP: 102/60  Pulse: 78  Temp: 36.7 C  Resp:     Post vital signs: Reviewed and stable  Level of consciousness: awake, alert  and oriented  Complications: No apparent anesthesia complications

## 2014-09-16 NOTE — Discharge Summary (Signed)
Physician Discharge Summary   Patient ID: [name] MRN: [id number] DOB/AGE: 02-03-1933 80 y.o.  Admit date: 08/17/2014 Discharge date: 08/19/14  Admission Diagnoses:  Active Problems:   Osteoarthrosis, unspecified whether generalized or localized, shoulder region   Discharge Diagnoses:  Same   Surgeries: Procedure(s): REVERSE RIGHT SHOULDER ARTHROPLASTY on 08/17/2014   Consultants: PT/OT  Discharged Condition: Stable  Hospital Course: [name] is an 80 y.o. female who was admitted 08/17/2014 with a chief complaint of No chief complaint on file. , and found to have a diagnosis of <principal problem not specified>.  They were brought to the operating room on 08/17/2014 and underwent the above named procedures.    The patient had an uncomplicated hospital course and was stable for discharge.  Recent vital signs:  Filed Vitals:   08/18/14 0547  BP: 102/60  Pulse: 78  Temp: 98.1 F (36.7 C)  Resp:     Recent laboratory studies:  Results for orders placed during the hospital encounter of 08/17/14  HEMOGLOBIN AND HEMATOCRIT, BLOOD      Result Value Ref Range   Hemoglobin 9.4 (*) 12.0 - 15.0 g/dL   HCT 27.1 (*) 36.0 - 85.2 %  BASIC METABOLIC PANEL      Result Value Ref Range   Sodium 138  137 - 147 mEq/L   Potassium 3.8  3.7 - 5.3 mEq/L   Chloride 101  96 - 112 mEq/L   CO2 27  19 - 32 mEq/L   Glucose, Bld 110 (*) 70 - 99 mg/dL   BUN 11  6 - 23 mg/dL   Creatinine, Ser 0.80  0.50 - 1.10 mg/dL   Calcium 8.5  8.4 - 10.5 mg/dL   GFR calc non Af Amer 68 (*) >90 mL/min   GFR calc Af Amer 79 (*) >90 mL/min   Anion gap 10  5 - 15  TYPE AND SCREEN      Result Value Ref Range   ABO/RH(D) O POS     Antibody Screen NEG     Sample Expiration 08/20/2014    ABO/RH      Result Value Ref Range   ABO/RH(D) O POS      Discharge Medications:     Medication List    STOP taking these medications       ibuprofen 200 MG tablet  Commonly known as:  ADVIL,MOTRIN        TAKE these medications       aspirin 81 MG tablet  Take 81 mg by mouth daily.     CALCIUM-VITAMIN D PO  Take by mouth.     cyclobenzaprine 5 MG tablet  Commonly known as:  FLEXERIL  Take 5 mg by mouth at bedtime as needed for muscle spasms.     FISH OIL PO  Take by mouth.     methocarbamol 500 MG tablet  Commonly known as:  ROBAXIN  Take 1 tablet (500 mg total) by mouth 3 (three) times daily as needed.     metoprolol tartrate 25 MG tablet  Commonly known as:  LOPRESSOR  Take 12.5 mg by mouth 2 (two) times daily.     oxyCODONE-acetaminophen 5-325 MG per tablet  Commonly known as:  ROXICET  Take 1-2 tablets by mouth every 4 (four) hours as needed for severe pain.     traMADol 50 MG tablet  Commonly known as:  ULTRAM  Take 50 mg by mouth every 4 (four) hours as needed (pain).  Diagnostic Studies: No results found.  Disposition: 01-Home or Self Care        Follow-up Information   Follow up with NORRIS,STEVEN R, MD. Call in 2 weeks. [phone])    Specialty:  Orthopedic Surgery   Contact information:   [address] [place] [address] [phone]        Signed: Ventura Bruns 09/16/2014, 9:01 PM

## 2015-11-11 ENCOUNTER — Encounter: Payer: Self-pay | Admitting: Neurology

## 2015-11-11 ENCOUNTER — Ambulatory Visit (INDEPENDENT_AMBULATORY_CARE_PROVIDER_SITE_OTHER): Payer: Medicare PPO | Admitting: Neurology

## 2015-11-11 VITALS — BP 108/74 | HR 84 | Resp 16 | Wt 118.0 lb

## 2015-11-11 DIAGNOSIS — F0390 Unspecified dementia without behavioral disturbance: Secondary | ICD-10-CM

## 2015-11-11 DIAGNOSIS — F03A0 Unspecified dementia, mild, without behavioral disturbance, psychotic disturbance, mood disturbance, and anxiety: Secondary | ICD-10-CM | POA: Insufficient documentation

## 2015-11-11 NOTE — Patient Instructions (Addendum)
1. Continue Aricept as you are taking it 2. Physical exercise and brain stimulation exercises are important for brain health (but do not overdo physical exercise) 3. Follow-up in 6 months

## 2015-11-11 NOTE — Progress Notes (Signed)
NEUROLOGY CONSULTATION NOTE  [name] MRN: [id number] DOB: 1933-03-21  Referring provider: Rochel Brome, MD Primary care provider: Rochel Brome, MD  Reason for consult:  Memory loss  Thank you for your kind referral of [name] for consultation of the above symptoms. Although her history is well known to you, please allow me to reiterate it for the purpose of our medical record. The patient was accompanied to the clinic by her daughter who also provides collateral information. Records and images were personally reviewed where available.  HISTORY OF PRESENT ILLNESS: This is a pleasant 81 year old right-handed woman with a history of chronic back pain, lymphoma, hypertension, presenting for evaluation of worsening memory and hallucinations. She lives alone, but her daughter just lives behind her house and sees her daily. Her daughter started noticing memory changes around 6 months ago, she was getting more forgetful, forgetting to pay her bills a couple of times. She misplaces things frequently and asks her daughter to help look for them quite often. She repeats herself. She started having hallucinations around 3 months ago, one time she saw and heard young people playing outside her window. Another time, she thought she saw someone trying to get into her car. She saw 2 men coming up the hallway in her house one time then disappearing. One time she woke up and saw a large rat at the foot of her bed, this episode was attributed to taking 2 different unrecalled medications for sleep. She has been taking Flexeril and Tramadol for close to a year and needs this for her back pain. Her daughter expressed concern that she has become less careful with her money, she has given up to $300 to different organizations. One time, she gave her social security number to someone on the phone, which she would have never done in the past. Her daughter fills out her pillbox, she is pretty good with remembering to  take them. She drives short distances without getting lost.   She denies any headaches, dizziness, diplopia, dysarthria, dysphagia, neck pain, focal numbness/tingling/weakness, bladder dysfunction. No anosmia, tremors, no falls. She has chronic back pain, occasional diarrhea. She denies any significant head injuries or alcohol intake. No family history of dementia.  She had an MRI brain without contrast done 08/27/15, images unavailable for review, per report, global atrophy without hydrocephalus, moderate small vessel disease type changes, no acute changes.   Laboratory Data: TSh and B12 done in PCP office last October 2016, results unavailable for review.  PAST MEDICAL HISTORY: Past Medical History  Diagnosis Date  . Hypertension   . Cancer [facility])     lymphoma right leg Dr Earl Gala  . H/O hiatal hernia   . Arthritis   . Diverticulitis   . Shortness of breath     with exertion, get dizzy with deep breathing  . Dysrhythmia     PAF (converted to SR on metoprolol); saw Dr. Shirlee More 11/2013 with PRN cardioloy recommended    PAST SURGICAL HISTORY: Past Surgical History  Procedure Laterality Date  . Colostomy      due to diverticlitis had colostomy for 1 year  . Colon surgery    . Eye surgery Bilateral     cataracts  . Appendectomy    . Reverse shoulder arthroplasty Right 08/17/2014    Procedure: REVERSE RIGHT SHOULDER ARTHROPLASTY;  Surgeon: Augustin Schooling, MD;  Location: [facility];  Service: Orthopedics;  Laterality: Right;    MEDICATIONS: Current Outpatient Prescriptions on File Prior  to Visit  Medication Sig Dispense Refill  . aspirin 81 MG tablet Take 81 mg by mouth daily.    Marland Kitchen CALCIUM-VITAMIN D PO Take by mouth.    . cyclobenzaprine (FLEXERIL) 5 MG tablet Take 5 mg by mouth at bedtime as needed for muscle spasms.    . metoprolol tartrate (LOPRESSOR) 25 MG tablet Take 12.5 mg by mouth 2 (two) times daily.    . Omega-3 Fatty Acids (FISH OIL PO) Take by mouth.    .  traMADol (ULTRAM) 50 MG tablet Take 50 mg by mouth every 4 (four) hours as needed (pain).     No current facility-administered medications on file prior to visit.    ALLERGIES: Allergies  Allergen Reactions  . Penicillins Anaphylaxis  . Aspirin Other (See Comments)    "makes me jittery"    FAMILY HISTORY: No family history on file.  SOCIAL HISTORY: Social History   Social History  . Marital Status: Widowed    Spouse Name: N/A  . Number of Children: 5  . Years of Education: N/A   Occupational History  . Not on file.   Social History Main Topics  . Smoking status: Former Smoker    Types: Cigarettes    Quit date: 08/14/1955  . Smokeless tobacco: Never Used  . Alcohol Use: No  . Drug Use: No  . Sexual Activity: Not on file   Other Topics Concern  . Not on file   Social History Narrative    REVIEW OF SYSTEMS: Constitutional: No fevers, chills, or sweats, no generalized fatigue, change in appetite Eyes: No visual changes, double vision, eye pain Ear, nose and throat: No hearing loss, ear pain, nasal congestion, sore throat Cardiovascular: No chest pain, palpitations Respiratory:  No shortness of breath at rest or with exertion, wheezes GastrointestinaI: No nausea, vomiting, diarrhea, abdominal pain, fecal incontinence Genitourinary:  No dysuria, urinary retention or frequency Musculoskeletal:  No neck pain, +back pain Integumentary: No rash, pruritus, skin lesions Neurological: as above Psychiatric: No depression, insomnia, anxiety Endocrine: No palpitations, fatigue, diaphoresis, mood swings, change in appetite, change in weight, increased thirst Hematologic/Lymphatic:  No anemia, purpura, petechiae. Allergic/Immunologic: no itchy/runny eyes, nasal congestion, recent allergic reactions, rashes  PHYSICAL EXAM: Filed Vitals:   11/11/15 1034  BP: 108/74  Pulse: 84  Resp: 16   General: No acute distress Head:  Normocephalic/atraumatic Eyes: Fundoscopic exam  shows bilateral sharp discs, no vessel changes, exudates, or hemorrhages Neck: supple, no paraspinal tenderness, full range of motion Back: No paraspinal tenderness Heart: regular rate and rhythm Lungs: Clear to auscultation bilaterally. Vascular: No carotid bruits. Skin/Extremities: No rash, no edema Neurological Exam: Mental status: alert and oriented to person, place, and month/year, no dysarthria or aphasia, Fund of knowledge is appropriate.  Remote memory intact.  Attention and concentration are normal.    Able to name objects and repeat phrases. CDT 5/5 MMSE - Mini Mental State Exam 11/11/2015  Orientation to time 3  Orientation to Place 5  Registration 3  Attention/ Calculation 2  Recall 1  Language- name 2 objects 2  Language- repeat 1  Language- follow 3 step command 3  Language- read & follow direction 1  Write a sentence 1  Copy design 0  Total score 22   Cranial nerves: CN I: not tested CN II: pupils equal, round and reactive to light, visual fields intact, fundi unremarkable. CN III, IV, VI:  full range of motion, no nystagmus, no ptosis CN V: facial sensation intact CN VII:  upper and lower face symmetric CN VIII: hearing intact to finger rub CN IX, X: gag intact, uvula midline CN XI: sternocleidomastoid and trapezius muscles intact CN XII: tongue midline Bulk & Tone: normal, no fasciculations. Motor: 5/5 throughout with no pronator drift. Sensation: intact to light touch, cold, pin, vibration and joint position sense.  No extinction to double simultaneous stimulation.  Romberg test negative Deep Tendon Reflexes: +2 throughout, no ankle clonus Plantar responses: downgoing bilaterally Cerebellar: no incoordination on finger to nose, heel to shin. No dysdiadochokinesia Gait: narrow-based and steady, no ataxia Tremor: none  IMPRESSION: This is a pleasant 81 year old right-handed woman with a history of hypertension, lymphoma, chronic back pain, presenting for  worsening memory loss and hallucinations. Her MMSE today is 22/30, previously 18/30 in September 2016 at PCP office. Her MRI brain did not show any acute changes, there was global atrophy and chronic microvascular disease. The patient and her daughter were under the impression that because the MRI did not show any significant changes, that she did not have dementia. We discussed the diagnosis of dementia, at this point, she has mild dementia, consideration for Lewy body dementia with the hallucinations. No other parkinsonian signs. We discussed diagnosis, prognosis, and expectation from medication. Continue Aricept 10mg  daily. Continue to monitor hallucinations. We discussed driving, continue to monitor. We discussed the importance of physical exercise and brain stimulation exercises for brain health. Images from [facility] will be requested for review. She will follow-up in 6 months.   Thank you for allowing me to participate in the care of this patient. Please do not hesitate to call for any questions or concerns.   Ellouise Newer, M.D.  CC: Dr. Tobie Poet

## 2015-12-24 DIAGNOSIS — I080 Rheumatic disorders of both mitral and aortic valves: Secondary | ICD-10-CM | POA: Diagnosis not present

## 2015-12-24 DIAGNOSIS — Z9221 Personal history of antineoplastic chemotherapy: Secondary | ICD-10-CM | POA: Diagnosis not present

## 2015-12-24 DIAGNOSIS — Z8572 Personal history of non-Hodgkin lymphomas: Secondary | ICD-10-CM | POA: Diagnosis not present

## 2015-12-24 DIAGNOSIS — H9193 Unspecified hearing loss, bilateral: Secondary | ICD-10-CM | POA: Diagnosis not present

## 2015-12-24 DIAGNOSIS — M1712 Unilateral primary osteoarthritis, left knee: Secondary | ICD-10-CM | POA: Diagnosis not present

## 2015-12-24 DIAGNOSIS — Z79891 Long term (current) use of opiate analgesic: Secondary | ICD-10-CM | POA: Diagnosis not present

## 2015-12-24 DIAGNOSIS — Z8619 Personal history of other infectious and parasitic diseases: Secondary | ICD-10-CM | POA: Diagnosis not present

## 2015-12-24 DIAGNOSIS — Z8701 Personal history of pneumonia (recurrent): Secondary | ICD-10-CM | POA: Diagnosis not present

## 2015-12-24 DIAGNOSIS — Z09 Encounter for follow-up examination after completed treatment for conditions other than malignant neoplasm: Secondary | ICD-10-CM | POA: Diagnosis not present

## 2015-12-24 DIAGNOSIS — F0390 Unspecified dementia without behavioral disturbance: Secondary | ICD-10-CM | POA: Diagnosis not present

## 2015-12-24 DIAGNOSIS — Z886 Allergy status to analgesic agent status: Secondary | ICD-10-CM | POA: Diagnosis not present

## 2015-12-24 DIAGNOSIS — G301 Alzheimer's disease with late onset: Secondary | ICD-10-CM | POA: Diagnosis not present

## 2015-12-24 DIAGNOSIS — H9190 Unspecified hearing loss, unspecified ear: Secondary | ICD-10-CM | POA: Diagnosis not present

## 2015-12-24 DIAGNOSIS — M810 Age-related osteoporosis without current pathological fracture: Secondary | ICD-10-CM | POA: Diagnosis not present

## 2015-12-24 DIAGNOSIS — Z9889 Other specified postprocedural states: Secondary | ICD-10-CM | POA: Diagnosis not present

## 2015-12-24 DIAGNOSIS — Z8719 Personal history of other diseases of the digestive system: Secondary | ICD-10-CM | POA: Diagnosis not present

## 2015-12-24 DIAGNOSIS — Z7982 Long term (current) use of aspirin: Secondary | ICD-10-CM | POA: Diagnosis not present

## 2015-12-24 DIAGNOSIS — Z79899 Other long term (current) drug therapy: Secondary | ICD-10-CM | POA: Diagnosis not present

## 2015-12-24 DIAGNOSIS — M65869 Other synovitis and tenosynovitis, unspecified lower leg: Secondary | ICD-10-CM | POA: Diagnosis not present

## 2015-12-24 DIAGNOSIS — R413 Other amnesia: Secondary | ICD-10-CM | POA: Diagnosis not present

## 2015-12-24 DIAGNOSIS — F0280 Dementia in other diseases classified elsewhere without behavioral disturbance: Secondary | ICD-10-CM | POA: Diagnosis not present

## 2015-12-24 DIAGNOSIS — Z08 Encounter for follow-up examination after completed treatment for malignant neoplasm: Secondary | ICD-10-CM | POA: Diagnosis not present

## 2015-12-24 DIAGNOSIS — Z8781 Personal history of (healed) traumatic fracture: Secondary | ICD-10-CM | POA: Diagnosis not present

## 2015-12-24 DIAGNOSIS — Z880 Allergy status to penicillin: Secondary | ICD-10-CM | POA: Diagnosis not present

## 2015-12-24 DIAGNOSIS — B009 Herpesviral infection, unspecified: Secondary | ICD-10-CM | POA: Diagnosis not present

## 2015-12-24 DIAGNOSIS — I4891 Unspecified atrial fibrillation: Secondary | ICD-10-CM | POA: Diagnosis not present

## 2015-12-24 DIAGNOSIS — K219 Gastro-esophageal reflux disease without esophagitis: Secondary | ICD-10-CM | POA: Diagnosis not present

## 2015-12-24 DIAGNOSIS — C8590 Non-Hodgkin lymphoma, unspecified, unspecified site: Secondary | ICD-10-CM | POA: Diagnosis not present

## 2015-12-24 DIAGNOSIS — Z87891 Personal history of nicotine dependence: Secondary | ICD-10-CM | POA: Diagnosis not present

## 2015-12-24 DIAGNOSIS — J309 Allergic rhinitis, unspecified: Secondary | ICD-10-CM | POA: Diagnosis not present

## 2016-01-16 DIAGNOSIS — R509 Fever, unspecified: Secondary | ICD-10-CM | POA: Diagnosis not present

## 2016-01-16 DIAGNOSIS — R05 Cough: Secondary | ICD-10-CM | POA: Diagnosis not present

## 2016-01-17 ENCOUNTER — Encounter: Payer: Self-pay | Admitting: Neurology

## 2016-01-31 DIAGNOSIS — J3089 Other allergic rhinitis: Secondary | ICD-10-CM | POA: Diagnosis not present

## 2016-01-31 DIAGNOSIS — R531 Weakness: Secondary | ICD-10-CM | POA: Diagnosis not present

## 2016-02-25 DIAGNOSIS — L237 Allergic contact dermatitis due to plants, except food: Secondary | ICD-10-CM | POA: Diagnosis not present

## 2016-04-30 ENCOUNTER — Encounter: Payer: Self-pay | Admitting: Family Medicine

## 2016-05-04 DIAGNOSIS — L237 Allergic contact dermatitis due to plants, except food: Secondary | ICD-10-CM | POA: Diagnosis not present

## 2016-05-11 ENCOUNTER — Ambulatory Visit: Payer: Medicare PPO | Admitting: Neurology

## 2016-05-12 DIAGNOSIS — Z13220 Encounter for screening for lipoid disorders: Secondary | ICD-10-CM | POA: Diagnosis not present

## 2016-05-12 DIAGNOSIS — R002 Palpitations: Secondary | ICD-10-CM | POA: Diagnosis not present

## 2016-05-12 DIAGNOSIS — M791 Myalgia: Secondary | ICD-10-CM | POA: Diagnosis not present

## 2016-05-12 DIAGNOSIS — G301 Alzheimer's disease with late onset: Secondary | ICD-10-CM | POA: Diagnosis not present

## 2016-05-12 DIAGNOSIS — M545 Low back pain: Secondary | ICD-10-CM | POA: Diagnosis not present

## 2016-05-12 DIAGNOSIS — R531 Weakness: Secondary | ICD-10-CM | POA: Diagnosis not present

## 2016-05-16 IMAGING — CR DG CHEST 2V
2 series · 2 of 2 positions shown · non-contrast
Comparison: Chest radiograph August 17, 2011 and chest CT
November 07, 2013

CLINICAL DATA: Preoperative shoulder surgery ; shortness of breath
; hypertension

EXAM:
CHEST  2 VIEW

[w chest pa]
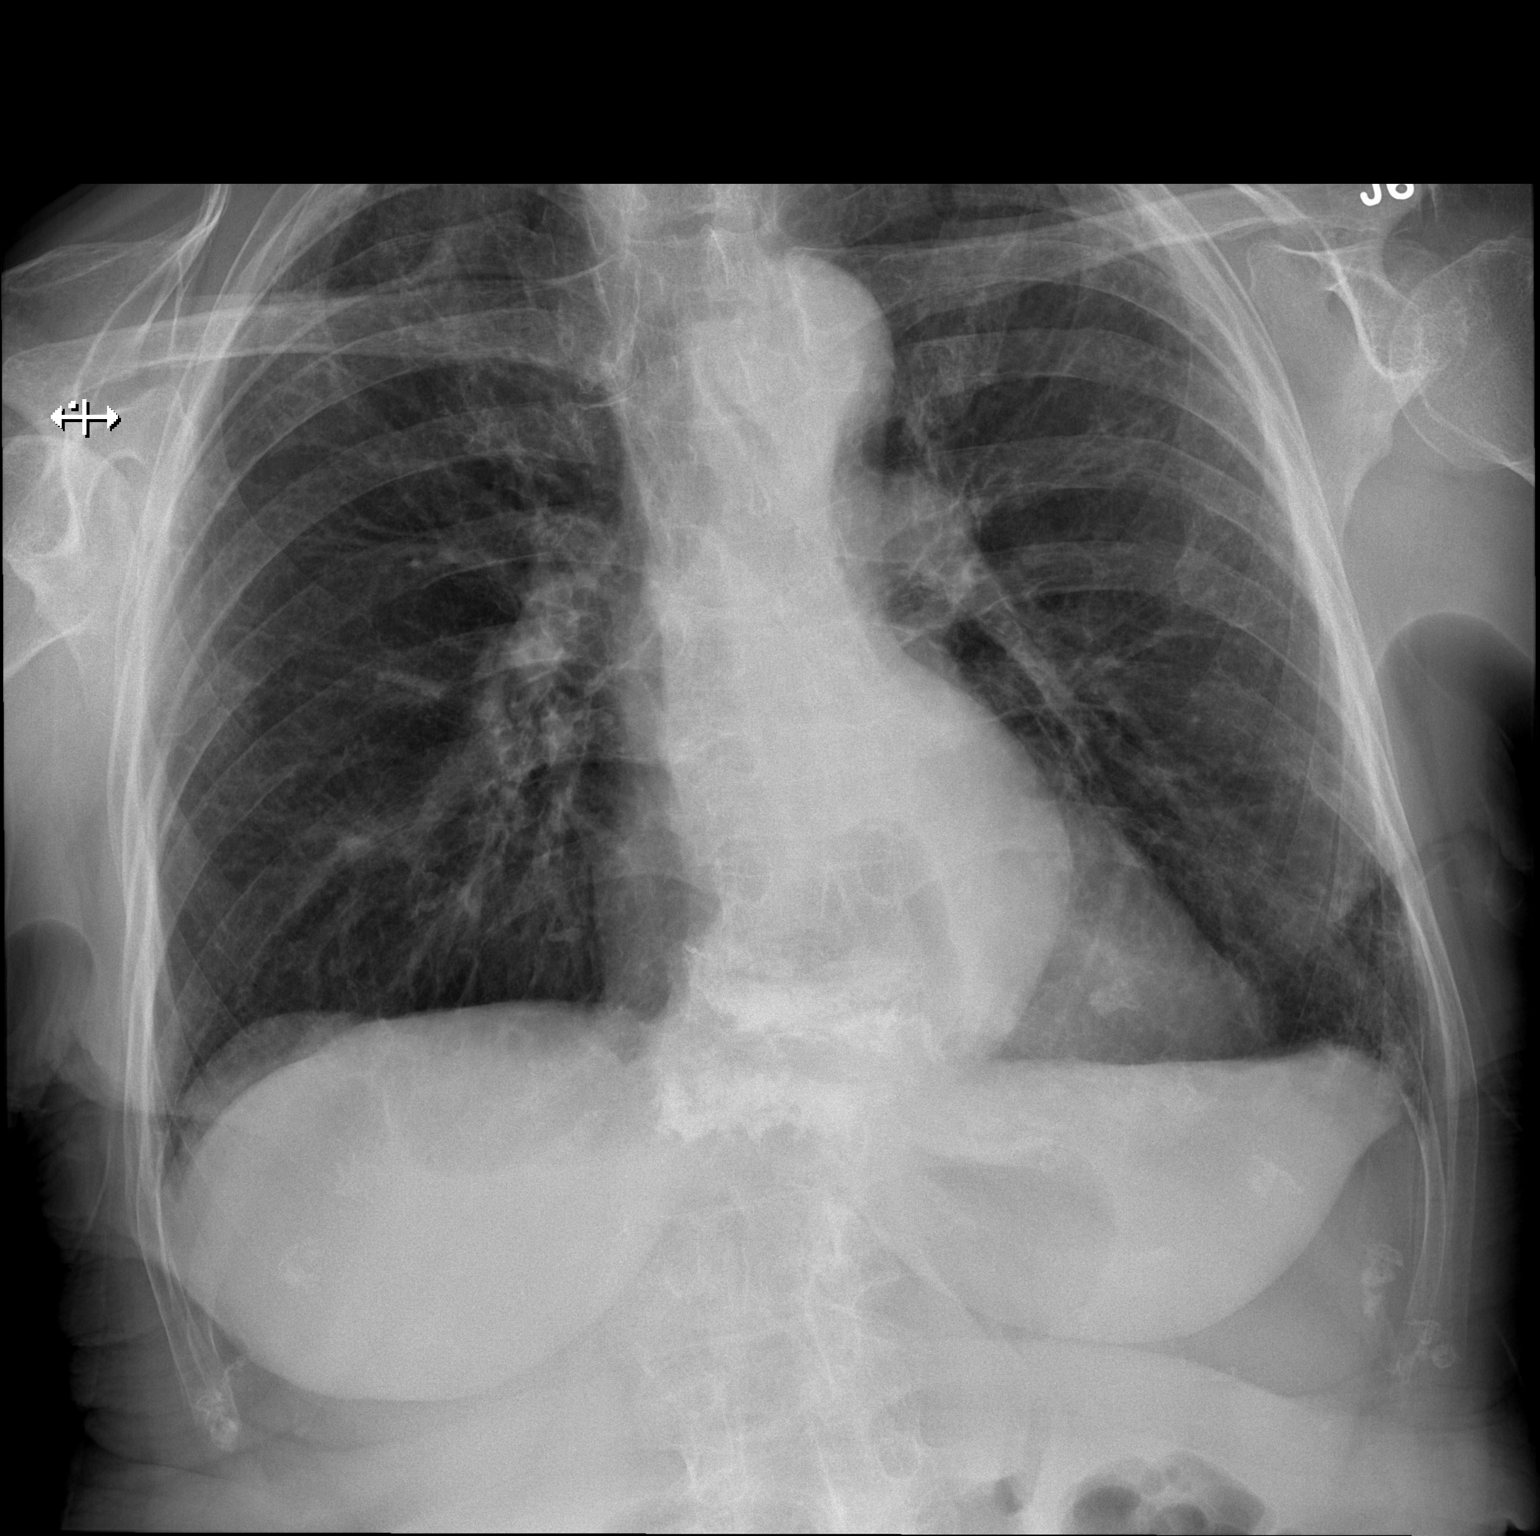

[w chest lat]
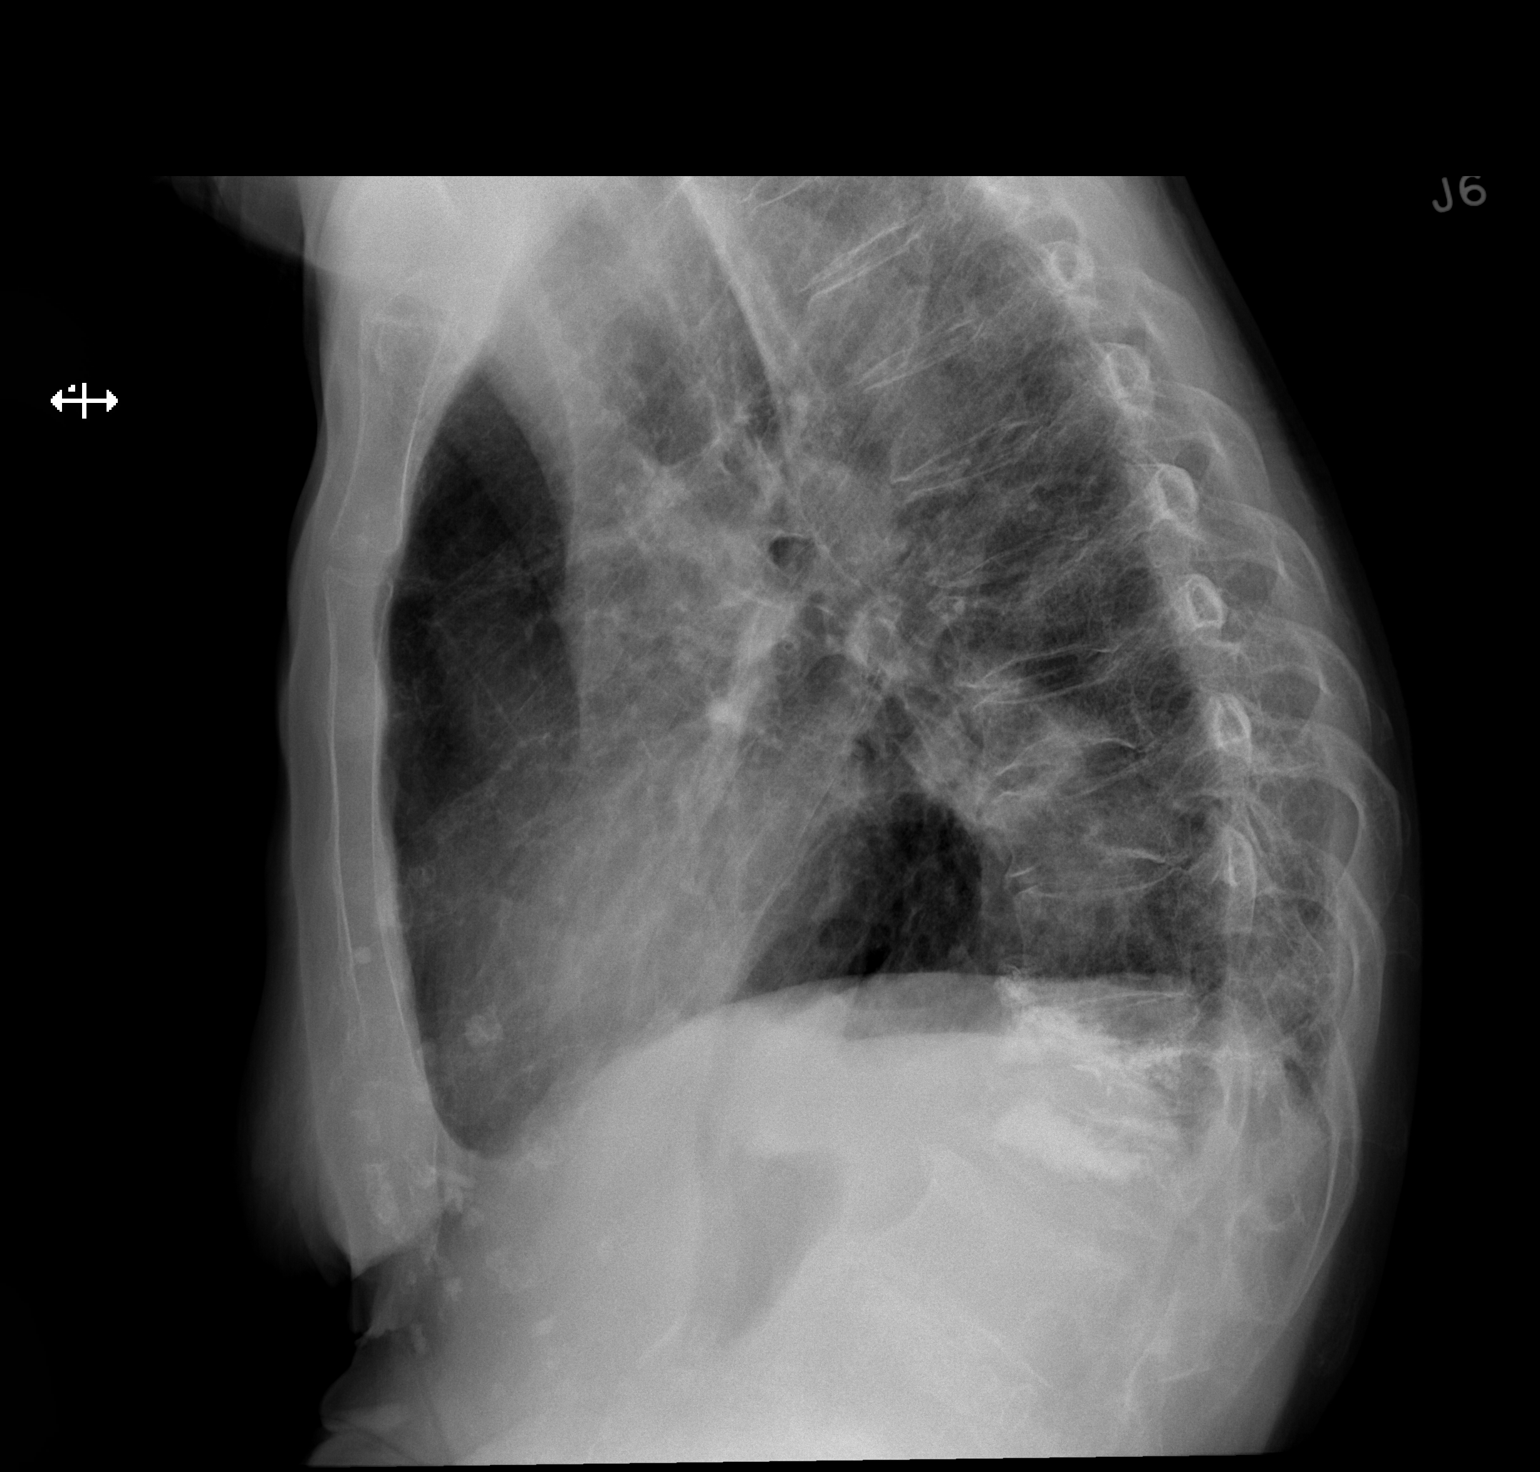

[2 of 2 positions shown; findings below may reference images not displayed]

FINDINGS: There is underlying emphysematous change. There is chronic
interstitial fibrotic change in the upper lobes bilaterally and in
the left base regions. There is no frank edema or consolidation. The
heart size is normal. The pulmonary vascularity reflects underlying
emphysematous change. No adenopathy. There is a hiatal hernia
present. Aorta is somewhat tortuous. There are multiple compression
fractures in the lower thoracic spine. The patient has undergone
kyphoplasty procedures at T11 and T12.
IMPRESSION: Underlying emphysema with areas of interstitial fibrotic change. No
frank edema or consolidation. Moderate hiatal hernia.

## 2016-05-20 IMAGING — CR DG SHOULDER 2+V*R*
1 series · 1 of 1 positions shown · non-contrast
Comparison: None.

CLINICAL DATA: Status post right shoulder replacement

EXAM:
RIGHT SHOULDER - 2+ VIEW

[AP]
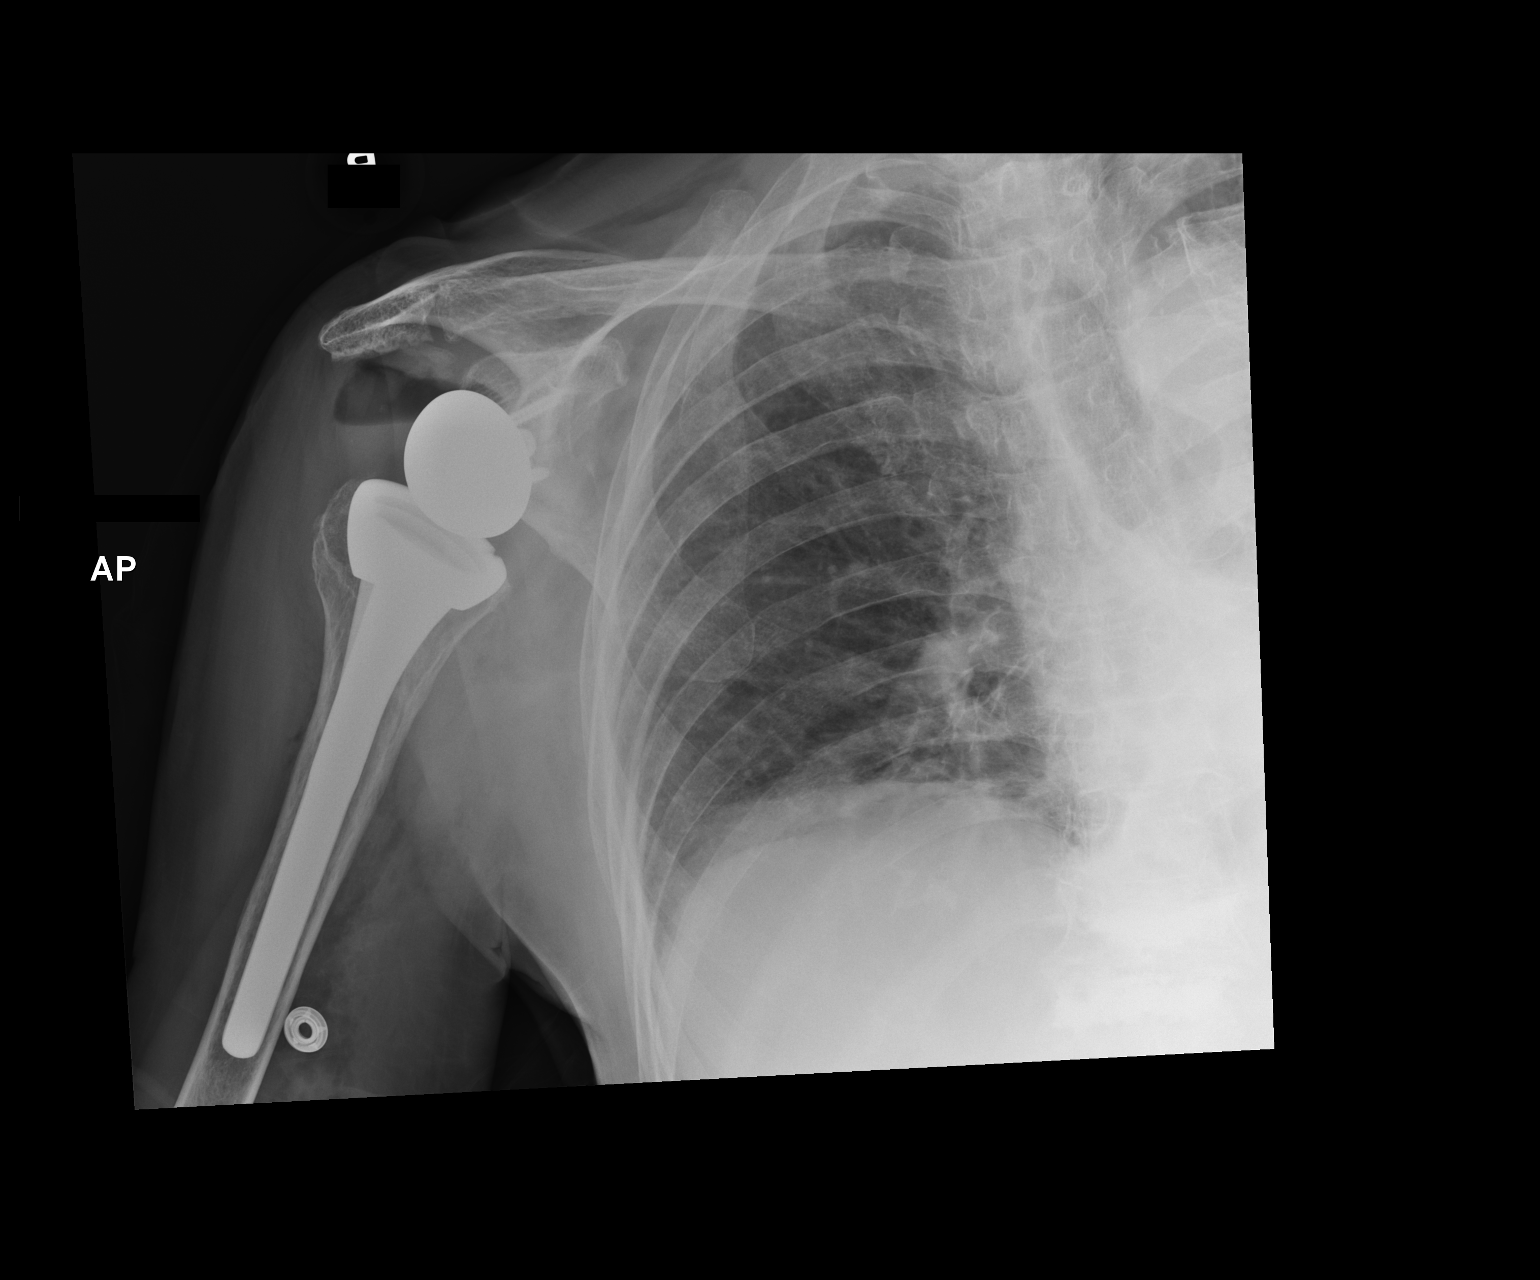

[1 of 1 positions shown; findings below may reference images not displayed]

FINDINGS: A right shoulder prosthesis is now seen. Air-fluid level is noted
within the joint space consistent with the recent surgery. No acute
abnormality is noted.

## 2016-05-28 DIAGNOSIS — I82432 Acute embolism and thrombosis of left popliteal vein: Secondary | ICD-10-CM | POA: Diagnosis not present

## 2016-05-28 DIAGNOSIS — I82412 Acute embolism and thrombosis of left femoral vein: Secondary | ICD-10-CM | POA: Diagnosis not present

## 2016-05-28 DIAGNOSIS — I824Z2 Acute embolism and thrombosis of unspecified deep veins of left distal lower extremity: Secondary | ICD-10-CM | POA: Diagnosis not present

## 2016-05-28 DIAGNOSIS — R2242 Localized swelling, mass and lump, left lower limb: Secondary | ICD-10-CM | POA: Diagnosis not present

## 2016-05-28 DIAGNOSIS — M79605 Pain in left leg: Secondary | ICD-10-CM | POA: Diagnosis not present

## 2016-07-01 DIAGNOSIS — G301 Alzheimer's disease with late onset: Secondary | ICD-10-CM | POA: Diagnosis not present

## 2016-07-01 DIAGNOSIS — F0280 Dementia in other diseases classified elsewhere without behavioral disturbance: Secondary | ICD-10-CM | POA: Diagnosis not present

## 2016-07-01 DIAGNOSIS — C8590 Non-Hodgkin lymphoma, unspecified, unspecified site: Secondary | ICD-10-CM | POA: Diagnosis not present

## 2016-07-01 DIAGNOSIS — C8338 Diffuse large B-cell lymphoma, lymph nodes of multiple sites: Secondary | ICD-10-CM | POA: Diagnosis not present

## 2016-07-13 DIAGNOSIS — I80202 Phlebitis and thrombophlebitis of unspecified deep vessels of left lower extremity: Secondary | ICD-10-CM | POA: Diagnosis not present

## 2016-07-13 DIAGNOSIS — R2242 Localized swelling, mass and lump, left lower limb: Secondary | ICD-10-CM | POA: Diagnosis not present

## 2016-07-24 ENCOUNTER — Ambulatory Visit: Payer: Medicare PPO | Admitting: Neurology

## 2016-07-29 ENCOUNTER — Encounter: Payer: Self-pay | Admitting: Neurology

## 2016-07-29 ENCOUNTER — Ambulatory Visit (INDEPENDENT_AMBULATORY_CARE_PROVIDER_SITE_OTHER): Payer: PPO | Admitting: Neurology

## 2016-07-29 VITALS — BP 114/62 | HR 61 | Temp 97.9°F | Ht 62.5 in | Wt 117.0 lb

## 2016-07-29 DIAGNOSIS — F03A0 Unspecified dementia, mild, without behavioral disturbance, psychotic disturbance, mood disturbance, and anxiety: Secondary | ICD-10-CM

## 2016-07-29 DIAGNOSIS — R441 Visual hallucinations: Secondary | ICD-10-CM | POA: Diagnosis not present

## 2016-07-29 DIAGNOSIS — F0390 Unspecified dementia without behavioral disturbance: Secondary | ICD-10-CM | POA: Diagnosis not present

## 2016-07-29 NOTE — Patient Instructions (Signed)
1. Continue all your medications 2. Control of blood pressure, cholesterol, as well as physical exercise and brain stimulation exercises are important for brain health 3. Follow-up in 1 year, call for any changes 

## 2016-07-29 NOTE — Progress Notes (Addendum)
NEUROLOGY FOLLOW UP OFFICE NOTE  [name] ED:[id number]  HISTORY OF PRESENT ILLNESS: I had the pleasure of seeing [name] in follow-up in the neurology clinic on 07/29/2016.  The patient was last seen 8 months ago for mild dementia, possibly Lewy Body dementia with their report of visual hallucinations. She is again accompanied by her daughter who helps supplement the history today. Since her last visit, Namenda had been added to her medications. Her daughter feels that this helps with personality changes, she has noticed an improvement. Memory has been stable. She continues to live alone, next door to her daughter, who is in charge of her medications, bill payments, and food. She only drives to church without any difficulties. She continues to have hallucinations, more at night lying in bed, she would see a bug, reach out for it then it disappears. She saw and heard children fighting on her dresser one time. This morning she saw what looked like a brown dog. She knows these are not real and are not bothersome/scary for her. She denies any side effects on Namenda and Aricept. She initially had dizziness on Aricept, which resolved when she took it at bedtime. She denies any headaches, dizziness, diplopia, dysarthria, dysphagia, neck pain, focal numbness/tingling/weakness, bladder dysfunction.   HPI 11/11/2015: This is a pleasant 82 yo RH woman with a history of chronic back pain, lymphoma, hypertension,with worsening memory and hallucinations. She lives alone, but her daughter just lives behind her house and sees her daily. Her daughter started noticing memory changes around 6 months ago, she was getting more forgetful, forgetting to pay her bills a couple of times. She misplaces things frequently and asks her daughter to help look for them quite often. She repeats herself. She started having hallucinations around 3 months ago, one time she saw and heard young people playing outside her window.  Another time, she thought she saw someone trying to get into her car. She saw 2 men coming up the hallway in her house one time then disappearing. One time she woke up and saw a large rat at the foot of her bed, this episode was attributed to taking 2 different unrecalled medications for sleep. She has been taking Flexeril and Tramadol for close to a year and needs this for her back pain. Her daughter expressed concern that she has become less careful with her money, she has given up to $300 to different organizations. One time, she gave her social security number to someone on the phone, which she would have never done in the past. Her daughter fills out her pillbox, she is pretty good with remembering to take them. She drives short distances without getting lost.   She denies any significant head injuries or alcohol intake. No family history of dementia.  She had an MRI brain without contrast done 08/27/15, images unavailable for review, per report, global atrophy without hydrocephalus, moderate small vessel disease type changes, no acute changes.  PAST MEDICAL HISTORY: Past Medical History:  Diagnosis Date  . Arthritis   . Cancer [facility])    lymphoma right leg Dr Earl Gala  . Diverticulitis   . Dysrhythmia    PAF (converted to SR on metoprolol); saw Dr. Shirlee More 11/2013 with PRN cardioloy recommended  . H/O hiatal hernia   . Hypertension   . Shortness of breath    with exertion, get dizzy with deep breathing    MEDICATIONS: Current Outpatient Prescriptions on File Prior to Visit  Medication Sig Dispense Refill  .  aspirin 81 MG tablet Take 81 mg by mouth daily.    Marland Kitchen CALCIUM-VITAMIN D PO Take by mouth.    . cyclobenzaprine (FLEXERIL) 5 MG tablet Take 5 mg by mouth at bedtime as needed for muscle spasms.    Marland Kitchen dicyclomine (BENTYL) 10 MG capsule 10 mg. Take 1 capsule 3 times daily    . donepezil (ARICEPT) 5 MG tablet Take 5 mg by mouth 2 (two) times daily.    . metoprolol tartrate  (LOPRESSOR) 25 MG tablet Take 12.5 mg by mouth 2 (two) times daily.    . traMADol (ULTRAM) 50 MG tablet Take 50 mg by mouth every 4 (four) hours as needed (pain).    Memantine 10mg  BID No current facility-administered medications on file prior to visit.     ALLERGIES: Allergies  Allergen Reactions  . Penicillins Anaphylaxis  . Aspirin Other (See Comments)    "makes me jittery"    FAMILY HISTORY: No family history on file.  SOCIAL HISTORY: Social History   Social History  . Marital status: Widowed    Spouse name: N/A  . Number of children: 5  . Years of education: N/A   Occupational History  . Not on file.   Social History Main Topics  . Smoking status: Former Smoker    Types: Cigarettes    Quit date: 08/14/1955  . Smokeless tobacco: Never Used  . Alcohol use No  . Drug use: No  . Sexual activity: Not on file   Other Topics Concern  . Not on file   Social History Narrative  . No narrative on file    REVIEW OF SYSTEMS: Constitutional: No fevers, chills, or sweats, no generalized fatigue, change in appetite Eyes: No visual changes, double vision, eye pain Ear, nose and throat: No hearing loss, ear pain, nasal congestion, sore throat Cardiovascular: No chest pain, palpitations Respiratory:  No shortness of breath at rest or with exertion, wheezes GastrointestinaI: No nausea, vomiting, diarrhea, abdominal pain, fecal incontinence Genitourinary:  No dysuria, urinary retention or frequency Musculoskeletal:  No neck pain, +back pain Integumentary: No rash, pruritus, skin lesions Neurological: as above Psychiatric: No depression, insomnia, anxiety Endocrine: No palpitations, fatigue, diaphoresis, mood swings, change in appetite, change in weight, increased thirst Hematologic/Lymphatic:  No anemia, purpura, petechiae. Allergic/Immunologic: no itchy/runny eyes, nasal congestion, recent allergic reactions, rashes  PHYSICAL EXAM: Vitals:   07/29/16 1143  BP: 114/62    Pulse: 61  Temp: 97.9 F (36.6 C)   General: No acute distress Head:  Normocephalic/atraumatic Neck: supple, no paraspinal tenderness, full range of motion Heart:  Regular rate and rhythm Lungs:  Clear to auscultation bilaterally Back: No paraspinal tenderness Skin/Extremities: No rash, no edema Neurological Exam: alert and oriented to person, place, and year. No aphasia or dysarthria. Fund of knowledge is appropriate.  Remote memory intact.  Attention and concentration are normal.    Able to name objects and repeat phrases. CDT 4/5 MMSE - Mini Mental State Exam 07/29/2016 11/11/2015  Orientation to time 2 3  Orientation to Place 5 5  Registration 3 3  Attention/ Calculation 3 2  Recall 0 1  Language- name 2 objects 2 2  Language- repeat 1 1  Language- follow 3 step command 3 3  Language- read & follow direction 1 1  Write a sentence 1 1  Copy design 1 0  Total score 22 22   Cranial nerves: Pupils equal, round, reactive to light.  Extraocular movements intact with no nystagmus. Visual fields full. Facial  sensation intact. No facial asymmetry. Tongue, uvula, palate midline.  Motor: Bulk and tone normal, muscle strength 5/5 throughout with no pronator drift.  Sensation to light touch intact.  No extinction to double simultaneous stimulation.  Deep tendon reflexes +1 throughout, toes downgoing.  Finger to nose testing intact.  Gait slow and cautious, no ataxia.  IMPRESSION: This is a pleasant 82 yo RH woman with a history of hypertension, lymphoma, chronic back pain,who presented with worsening memory loss and hallucinations. Her MMSE today is again 22/30 (22/30 in December 2016, 18/30 in September 2016 at PCP office), suggestive of mild dementia, possibly Lewy Body dementia with hallucinations being a major symptom, although this can also be seen with Alzheimers disease. Her MRI brain did not show any acute changes, there was global atrophy and chronic microvascular disease. Namenda has  been added by her PCP. They feel memory has been stable, continue all current medications. Continue to monitor hallucinations. We again discussed the importance of physical exercise and brain stimulation exercises for brain health. She will follow-up in 1 year and knows to call for any changes.  Thank you for allowing me to participate in her care.  Please do not hesitate to call for any questions or concerns.  The duration of this appointment visit was 25 minutes of face-to-face time with the patient.  Greater than 50% of this time was spent in counseling, explanation of diagnosis, planning of further management, and coordination of care.   Ellouise Newer, M.D.   CC: Dr. Tobie Poet

## 2016-08-19 DIAGNOSIS — G301 Alzheimer's disease with late onset: Secondary | ICD-10-CM | POA: Diagnosis not present

## 2016-08-19 DIAGNOSIS — R413 Other amnesia: Secondary | ICD-10-CM | POA: Diagnosis not present

## 2016-08-19 DIAGNOSIS — Z23 Encounter for immunization: Secondary | ICD-10-CM | POA: Diagnosis not present

## 2016-08-19 DIAGNOSIS — I80202 Phlebitis and thrombophlebitis of unspecified deep vessels of left lower extremity: Secondary | ICD-10-CM | POA: Diagnosis not present

## 2016-08-19 DIAGNOSIS — M545 Low back pain: Secondary | ICD-10-CM | POA: Diagnosis not present

## 2016-08-19 DIAGNOSIS — R531 Weakness: Secondary | ICD-10-CM | POA: Diagnosis not present

## 2016-08-19 DIAGNOSIS — R4182 Altered mental status, unspecified: Secondary | ICD-10-CM | POA: Diagnosis not present

## 2016-08-19 DIAGNOSIS — R5383 Other fatigue: Secondary | ICD-10-CM | POA: Diagnosis not present

## 2016-09-10 DIAGNOSIS — I80202 Phlebitis and thrombophlebitis of unspecified deep vessels of left lower extremity: Secondary | ICD-10-CM | POA: Diagnosis not present

## 2016-09-10 DIAGNOSIS — M79605 Pain in left leg: Secondary | ICD-10-CM | POA: Diagnosis not present

## 2016-09-10 DIAGNOSIS — R5383 Other fatigue: Secondary | ICD-10-CM | POA: Diagnosis not present

## 2016-09-10 DIAGNOSIS — M79662 Pain in left lower leg: Secondary | ICD-10-CM | POA: Diagnosis not present

## 2016-09-10 DIAGNOSIS — M7989 Other specified soft tissue disorders: Secondary | ICD-10-CM | POA: Diagnosis not present

## 2016-09-10 DIAGNOSIS — R531 Weakness: Secondary | ICD-10-CM | POA: Diagnosis not present

## 2016-09-10 DIAGNOSIS — M7732 Calcaneal spur, left foot: Secondary | ICD-10-CM | POA: Diagnosis not present

## 2016-10-05 DIAGNOSIS — H524 Presbyopia: Secondary | ICD-10-CM | POA: Diagnosis not present

## 2016-10-05 DIAGNOSIS — H353131 Nonexudative age-related macular degeneration, bilateral, early dry stage: Secondary | ICD-10-CM | POA: Diagnosis not present

## 2016-10-05 DIAGNOSIS — H26493 Other secondary cataract, bilateral: Secondary | ICD-10-CM | POA: Diagnosis not present

## 2016-12-08 DIAGNOSIS — G301 Alzheimer's disease with late onset: Secondary | ICD-10-CM | POA: Diagnosis not present

## 2016-12-08 DIAGNOSIS — F411 Generalized anxiety disorder: Secondary | ICD-10-CM | POA: Diagnosis not present

## 2017-01-19 ENCOUNTER — Telehealth: Payer: Self-pay | Admitting: Neurology

## 2017-01-19 MED ORDER — QUETIAPINE FUMARATE 25 MG PO TABS: ORAL_TABLET | ORAL | 5 refills | Status: DC

## 2017-01-19 NOTE — Telephone Encounter (Signed)
Discussed that the hallucinations are part of dementia as condition worsens. We can start low dose Seroquel and see if this helps, start low dose 25mg  1/2 tab qhs. Side effects, including black box warning for cardiac symptoms, were discussed. Daughter agrees to start medication and will let us know if any problems.

## 2017-01-19 NOTE — Telephone Encounter (Signed)
Please advise 

## 2017-01-19 NOTE — Telephone Encounter (Signed)
[name] October 28, 2033. Her daughter [name] called to see if her mother could have a sooner appointment with Dr. Delice Lesch. She was able to move it up to May but she feels her mom needs to be seen sooner. She said her mother is hearing voices and talking back to them and that this goes on all day. She also believes everything the voices are telling her. Her daughter Brenda's # is [phone]. Thank you

## 2017-01-19 NOTE — Telephone Encounter (Signed)
Left VM

## 2017-02-09 DIAGNOSIS — J060 Acute laryngopharyngitis: Secondary | ICD-10-CM | POA: Diagnosis not present

## 2017-02-09 DIAGNOSIS — N3000 Acute cystitis without hematuria: Secondary | ICD-10-CM | POA: Diagnosis not present

## 2017-02-09 DIAGNOSIS — I80202 Phlebitis and thrombophlebitis of unspecified deep vessels of left lower extremity: Secondary | ICD-10-CM | POA: Diagnosis not present

## 2017-03-09 DIAGNOSIS — G301 Alzheimer's disease with late onset: Secondary | ICD-10-CM | POA: Diagnosis not present

## 2017-03-09 DIAGNOSIS — R4182 Altered mental status, unspecified: Secondary | ICD-10-CM | POA: Diagnosis not present

## 2017-03-09 DIAGNOSIS — R531 Weakness: Secondary | ICD-10-CM | POA: Diagnosis not present

## 2017-04-09 ENCOUNTER — Telehealth: Payer: Self-pay | Admitting: Neurology

## 2017-04-09 ENCOUNTER — Encounter: Payer: Self-pay | Admitting: Neurology

## 2017-04-09 ENCOUNTER — Ambulatory Visit (INDEPENDENT_AMBULATORY_CARE_PROVIDER_SITE_OTHER): Payer: PPO | Admitting: Neurology

## 2017-04-09 VITALS — BP 98/60 | HR 68 | Ht 64.0 in | Wt 125.0 lb

## 2017-04-09 DIAGNOSIS — F0391 Unspecified dementia with behavioral disturbance: Secondary | ICD-10-CM | POA: Diagnosis not present

## 2017-04-09 DIAGNOSIS — R2681 Unsteadiness on feet: Secondary | ICD-10-CM | POA: Diagnosis not present

## 2017-04-09 DIAGNOSIS — F03B18 Unspecified dementia, moderate, with other behavioral disturbance: Secondary | ICD-10-CM

## 2017-04-09 MED ORDER — MEMANTINE HCL 10 MG PO TABS: 10.0000 mg | ORAL_TABLET | Freq: Two times a day (BID) | ORAL | 3 refills | Status: AC

## 2017-04-09 MED ORDER — QUETIAPINE FUMARATE 25 MG PO TABS: ORAL_TABLET | ORAL | 11 refills | Status: DC

## 2017-04-09 MED ORDER — DONEPEZIL HCL 10 MG PO TABS: 10.0000 mg | ORAL_TABLET | Freq: Every day | ORAL | 3 refills | Status: AC

## 2017-04-09 NOTE — Telephone Encounter (Signed)
Please send all prescriptions to the [facility] for [facility]

## 2017-04-09 NOTE — Progress Notes (Signed)
NEUROLOGY FOLLOW UP OFFICE NOTE  [name] [id number]  HISTORY OF PRESENT ILLNESS: I had the pleasure of seeing [name] in follow-up in the neurology clinic on 04/09/2017.  The patient was last seen 8 months ago for mild dementia, possibly Lewy Body dementia with their report of visual hallucinations. She is again accompanied by her daughter who helps supplement the history today. She is taking Aricept 10mg  daily and Namenda 10mg  BID without side effects. Since her last visit, her daughter called [place] in February to report that her mother is now hearing voices and talking back to them, going on all day. She believes everything the voices are telling her. She was started on low dose Seroquel. This has helped significantly, she is not having any hallucinations per daughter. She is more drowsy, she is falling asleep in the office today. Her daughter reports that she sleeps pretty well overall, some nights she has urinary and bowel incontinence in her diaper and is up several times at night, but even with nights when she sleeps well, she still falls asleep easily in the daytime. Sometimes she falls asleep on the commode. She now lives with her daughter who manages her medications. She needs help with dressing and bathing. Sometimes she refuses to bathe. She is able to feed herself, but her daughter reports some days she has trouble with that as well. Sometimes she does not even talk. Her hearing has worsened, "she can't hear nothing." She has had more behavioral and personality changes, she can get very stubborn. She is more OCD, she picks at her food up to an hour in the morning, taking off the crust on her bread and picking at them. Her daughter feels she is very feeble and thinks she needs a walker, her hands are trembling a lot. She was complaining of headaches, which stopped when Allegra was started. Her nose runs constantly. Her left eyelid is a little droopy on the lateral side.   HPI  11/11/2015: This is a pleasant 83 yo RH woman with a history of chronic back pain, lymphoma, hypertension,with worsening memory and hallucinations. She lives alone, but her daughter just lives behind her house and sees her daily. Her daughter started noticing memory changes around 6 months ago, she was getting more forgetful, forgetting to pay her bills a couple of times. She misplaces things frequently and asks her daughter to help look for them quite often. She repeats herself. She started having hallucinations around 3 months ago, one time she saw and heard young people playing outside her window. Another time, she thought she saw someone trying to get into her car. She saw 2 men coming up the hallway in her house one time then disappearing. One time she woke up and saw a large rat at the foot of her bed, this episode was attributed to taking 2 different unrecalled medications for sleep. She has been taking Flexeril and Tramadol for close to a year and needs this for her back pain. Her daughter expressed concern that she has become less careful with her money, she has given up to $300 to different organizations. One time, she gave her social security number to someone on the phone, which she would have never done in the past. Her daughter fills out her pillbox, she is pretty good with remembering to take them. She drives short distances without getting lost.   She denies any significant head injuries or alcohol intake. No family history of dementia.  She had an  MRI brain without contrast done 08/27/15, images unavailable for review, per report, global atrophy without hydrocephalus, moderate small vessel disease type changes, no acute changes.  PAST MEDICAL HISTORY: Past Medical History:  Diagnosis Date  . Arthritis   . Cancer [facility])    lymphoma right leg Dr Earl Gala  . Diverticulitis   . Dysrhythmia    PAF (converted to SR on metoprolol); saw Dr. Shirlee More 11/2013 with PRN cardioloy  recommended  . H/O hiatal hernia   . Hypertension   . Shortness of breath    with exertion, get dizzy with deep breathing    MEDICATIONS:  Outpatient Encounter Prescriptions as of 04/09/2017  Medication Sig Note  . aspirin 81 MG tablet Take 81 mg by mouth daily.   Marland Kitchen CALCIUM-VITAMIN D PO Take by mouth.   . dicyclomine (BENTYL) 10 MG capsule 10 mg. Take 1 capsule 3 times daily 11/11/2015: Received from: External Pharmacy Received Sig:   . donepezil (ARICEPT) 5 MG tablet Take 5 mg by mouth 2 (two) times daily.   . memantine (NAMENDA) 10 MG tablet Take 10 mg by mouth. 07/29/2016: Received from: [facility]: Take 10 mg by mouth Two (2) times a day.  Marland Kitchen QUEtiapine (SEROQUEL) 25 MG tablet Take 1/2 tablet at night   . apixaban (ELIQUIS) 2.5 MG TABS tablet Take 2.5 mg by mouth. 07/29/2016: Received from: [place]: Take 2.5 mg by mouth Two (2) times a day.  . cyclobenzaprine (FLEXERIL) 5 MG tablet Take 5 mg by mouth at bedtime as needed for muscle spasms.   . metoprolol tartrate (LOPRESSOR) 25 MG tablet Take 12.5 mg by mouth 2 (two) times daily.   . traMADol (ULTRAM) 50 MG tablet Take 50 mg by mouth every 4 (four) hours as needed (pain).    No facility-administered encounter medications on file as of 04/09/2017.     ALLERGIES: Allergies  Allergen Reactions  . Penicillins Anaphylaxis  . Aspirin Other (See Comments)    "makes me jittery"    FAMILY HISTORY: No family history on file.  SOCIAL HISTORY: Social History   Social History  . Marital status: Widowed    Spouse name: N/A  . Number of children: 5  . Years of education: N/A   Occupational History  . Not on file.   Social History Main Topics  . Smoking status: Former Smoker    Types: Cigarettes    Quit date: 08/14/1955  . Smokeless tobacco: Never Used  . Alcohol use No  . Drug use: No  . Sexual activity: Not on file   Other Topics Concern  . Not on file   Social History Narrative  . No  narrative on file    REVIEW OF SYSTEMS: Constitutional: No fevers, chills, or sweats, no generalized fatigue, change in appetite Eyes: No visual changes, double vision, eye pain Ear, nose and throat: No hearing loss, ear pain, nasal congestion, sore throat Cardiovascular: No chest pain, palpitations Respiratory:  No shortness of breath at rest or with exertion, wheezes GastrointestinaI: No nausea, vomiting, diarrhea, abdominal pain, fecal incontinence Genitourinary:  No dysuria, urinary retention or frequency Musculoskeletal:  No neck pain, +back pain Integumentary: No rash, pruritus, skin lesions Neurological: as above Psychiatric: No depression, insomnia, anxiety Endocrine: No palpitations, fatigue, diaphoresis, mood swings, change in appetite, change in weight, increased thirst Hematologic/Lymphatic:  No anemia, purpura, petechiae. Allergic/Immunologic: no itchy/runny eyes, nasal congestion, recent allergic reactions, rashes  PHYSICAL EXAM: Vitals:   04/09/17 1324  BP: 98/60  Pulse: 68   General: No acute distress, drowsy, arousable to follow simple commands but very hard of hearing. Head:  Normocephalic/atraumatic Neck: supple, no paraspinal tenderness, full range of motion Heart:  Regular rate and rhythm Lungs:  Clear to auscultation bilaterally Back: No paraspinal tenderness Skin/Extremities: No rash, no edema Neurological Exam: Unable to do MMSE due to significant hearing difficulties. She can follow simple commands but had right-left confusion when asked to touch her left ear with right hand. Cranial nerves: Pupils equal, round, reactive to light.  Extraocular movements intact with no nystagmus. Visual fields full. Facial sensation intact. No facial asymmetry. Tongue, uvula, palate midline.  Motor: Bulk and tone normal, muscle strength 5/5 throughout with no pronator drift.  Sensation to light touch intact.  No extinction to double simultaneous stimulation.  Deep tendon  reflexes +1 throughout, toes downgoing.  Finger to nose testing intact.  Gait slow and cautious, no ataxia.  IMPRESSION: This is a pleasant 83 yo RH woman with a history of hypertension, lymphoma, chronic back pain,who presented with worsening memory loss and hallucinations. Her MMSE in August 2017 was 22/30, unable to do today due to drowsiness and significant hearing loss. Her daughter reports worsening of memory, as well as more behavioral and personality changes. She was having visual and auditory hallucinations, currently controlled on low dose Seroquel 12.5mg  qhs. She has daytime drowsiness, she is not on much sedating medications except the low dose Seroquel. She takes Tramadol only once a day. This may relate to underlying dementia, she would benefit from participating in day programs to help with day-night reversal. We again discussed home safety and eventual transition to [place], her daughter wants her to stay at home until she cannot help her anymore, information for Home Health care given today. She is more unsteady walking, a prescription for a rolling walker was provided. She will follow-up in 6 months and knows to call for any changes.  Thank you for allowing me to participate in her care.  Please do not hesitate to call for any questions or concerns.  The duration of this appointment visit was 25 minutes of face-to-face time with the patient.  Greater than 50% of this time was spent in counseling, explanation of diagnosis, planning of further management, and coordination of care.   Ellouise Newer, M.D.   CC: Dr. Tobie Poet

## 2017-04-09 NOTE — Patient Instructions (Signed)
1. Continue all your medications 2. Look into adult day programs and home health  3. Use walker 4. Continue to monitor home and fall safety 5. Follow-up in 6 months, call for any changes

## 2017-04-22 DIAGNOSIS — I70203 Unspecified atherosclerosis of native arteries of extremities, bilateral legs: Secondary | ICD-10-CM | POA: Diagnosis not present

## 2017-05-09 DIAGNOSIS — R404 Transient alteration of awareness: Secondary | ICD-10-CM | POA: Diagnosis not present

## 2017-05-09 DIAGNOSIS — F0390 Unspecified dementia without behavioral disturbance: Secondary | ICD-10-CM | POA: Diagnosis not present

## 2017-05-09 DIAGNOSIS — W19XXXA Unspecified fall, initial encounter: Secondary | ICD-10-CM | POA: Diagnosis not present

## 2017-05-09 DIAGNOSIS — R102 Pelvic and perineal pain: Secondary | ICD-10-CM | POA: Diagnosis not present

## 2017-05-09 DIAGNOSIS — I517 Cardiomegaly: Secondary | ICD-10-CM | POA: Diagnosis not present

## 2017-05-09 DIAGNOSIS — R4182 Altered mental status, unspecified: Secondary | ICD-10-CM | POA: Diagnosis not present

## 2017-05-09 DIAGNOSIS — S299XXA Unspecified injury of thorax, initial encounter: Secondary | ICD-10-CM | POA: Diagnosis not present

## 2017-05-09 DIAGNOSIS — R410 Disorientation, unspecified: Secondary | ICD-10-CM | POA: Diagnosis not present

## 2017-05-09 DIAGNOSIS — Z7982 Long term (current) use of aspirin: Secondary | ICD-10-CM | POA: Diagnosis not present

## 2017-05-09 DIAGNOSIS — J329 Chronic sinusitis, unspecified: Secondary | ICD-10-CM | POA: Diagnosis not present

## 2017-05-09 DIAGNOSIS — M1990 Unspecified osteoarthritis, unspecified site: Secondary | ICD-10-CM | POA: Diagnosis not present

## 2017-05-09 DIAGNOSIS — Z79899 Other long term (current) drug therapy: Secondary | ICD-10-CM | POA: Diagnosis not present

## 2017-05-09 DIAGNOSIS — G9340 Encephalopathy, unspecified: Secondary | ICD-10-CM | POA: Diagnosis not present

## 2017-05-09 DIAGNOSIS — Z9181 History of falling: Secondary | ICD-10-CM | POA: Diagnosis not present

## 2017-05-09 DIAGNOSIS — R531 Weakness: Secondary | ICD-10-CM | POA: Diagnosis not present

## 2017-05-09 DIAGNOSIS — S0993XA Unspecified injury of face, initial encounter: Secondary | ICD-10-CM | POA: Diagnosis not present

## 2017-05-09 DIAGNOSIS — I639 Cerebral infarction, unspecified: Secondary | ICD-10-CM | POA: Diagnosis not present

## 2017-05-10 DIAGNOSIS — G9340 Encephalopathy, unspecified: Secondary | ICD-10-CM | POA: Diagnosis not present

## 2017-05-10 DIAGNOSIS — M1990 Unspecified osteoarthritis, unspecified site: Secondary | ICD-10-CM | POA: Diagnosis not present

## 2017-05-10 DIAGNOSIS — R079 Chest pain, unspecified: Secondary | ICD-10-CM | POA: Diagnosis not present

## 2017-05-10 DIAGNOSIS — R4781 Slurred speech: Secondary | ICD-10-CM | POA: Diagnosis not present

## 2017-05-10 DIAGNOSIS — I639 Cerebral infarction, unspecified: Secondary | ICD-10-CM | POA: Diagnosis not present

## 2017-05-10 DIAGNOSIS — S0990XA Unspecified injury of head, initial encounter: Secondary | ICD-10-CM | POA: Diagnosis not present

## 2017-05-10 DIAGNOSIS — I350 Nonrheumatic aortic (valve) stenosis: Secondary | ICD-10-CM | POA: Diagnosis not present

## 2017-05-10 DIAGNOSIS — I351 Nonrheumatic aortic (valve) insufficiency: Secondary | ICD-10-CM | POA: Diagnosis not present

## 2017-05-10 DIAGNOSIS — R531 Weakness: Secondary | ICD-10-CM | POA: Diagnosis not present

## 2017-05-10 DIAGNOSIS — J329 Chronic sinusitis, unspecified: Secondary | ICD-10-CM | POA: Diagnosis not present

## 2017-05-10 DIAGNOSIS — W19XXXA Unspecified fall, initial encounter: Secondary | ICD-10-CM | POA: Diagnosis not present

## 2017-05-10 DIAGNOSIS — F0390 Unspecified dementia without behavioral disturbance: Secondary | ICD-10-CM | POA: Diagnosis not present

## 2017-05-13 DIAGNOSIS — F0390 Unspecified dementia without behavioral disturbance: Secondary | ICD-10-CM | POA: Diagnosis not present

## 2017-05-13 DIAGNOSIS — M1990 Unspecified osteoarthritis, unspecified site: Secondary | ICD-10-CM | POA: Diagnosis not present

## 2017-05-13 DIAGNOSIS — W19XXXD Unspecified fall, subsequent encounter: Secondary | ICD-10-CM | POA: Diagnosis not present

## 2017-05-13 DIAGNOSIS — Z9181 History of falling: Secondary | ICD-10-CM | POA: Diagnosis not present

## 2017-05-13 DIAGNOSIS — M6281 Muscle weakness (generalized): Secondary | ICD-10-CM | POA: Diagnosis not present

## 2017-05-13 DIAGNOSIS — G9340 Encephalopathy, unspecified: Secondary | ICD-10-CM | POA: Diagnosis not present

## 2017-05-13 DIAGNOSIS — R2689 Other abnormalities of gait and mobility: Secondary | ICD-10-CM | POA: Diagnosis not present

## 2017-05-13 DIAGNOSIS — Z8572 Personal history of non-Hodgkin lymphomas: Secondary | ICD-10-CM | POA: Diagnosis not present

## 2017-05-13 DIAGNOSIS — Z7982 Long term (current) use of aspirin: Secondary | ICD-10-CM | POA: Diagnosis not present

## 2017-05-13 DIAGNOSIS — Z7902 Long term (current) use of antithrombotics/antiplatelets: Secondary | ICD-10-CM | POA: Diagnosis not present

## 2017-05-24 ENCOUNTER — Encounter: Payer: Self-pay | Admitting: Neurology

## 2017-05-24 ENCOUNTER — Ambulatory Visit (INDEPENDENT_AMBULATORY_CARE_PROVIDER_SITE_OTHER): Payer: PPO | Admitting: Neurology

## 2017-05-24 VITALS — BP 104/58 | HR 72 | Resp 16 | Ht 64.0 in | Wt 130.0 lb

## 2017-05-24 DIAGNOSIS — G939 Disorder of brain, unspecified: Secondary | ICD-10-CM | POA: Diagnosis not present

## 2017-05-24 DIAGNOSIS — F0391 Unspecified dementia with behavioral disturbance: Secondary | ICD-10-CM

## 2017-05-24 DIAGNOSIS — F03B18 Unspecified dementia, moderate, with other behavioral disturbance: Secondary | ICD-10-CM

## 2017-05-24 NOTE — Progress Notes (Signed)
NEUROLOGY FOLLOW UP OFFICE NOTE  [name] [id number]  DOB: [birth year]  HISTORY OF PRESENT ILLNESS: I had the pleasure of seeing [name] in follow-up in the neurology clinic on 05/24/2017.  The patient was last seen a month ago for mild dementia, possibly Lewy Body dementia with report of visual hallucinations. She presents for an urgent follow-up after hospitalization, records are unavailable for review. She is again accompanied by her daughter and another daughter who help supplement the history today. They report she had a fall 3 weeks ago then after a few days she was getting more confused. They brought her to [facility] where she had a head CT then an MRI brain where they were told she had a "lesion on the right side, with some characteristics of stroke or tumor," and was instructed to follow-up with Neurology. Her daughters report that bloodwork and urinalysis were unremarkable. She was started on antibiotics for a sinus infection, and seemed much better on hospital discharge, then started worsening again at home. She now lives with her daughter full time, and needs help with everything. She is messy when she feeds herself, but they let her do it. Her daughter had previously mentioned trembling in her hands, but she now has a right hand resting tremor that was not noted on last visit. Her daughter reports she tends to lean to the left side, she needs 2-person assistance with transfers. She has home PT once a week and uses a walker to get around. She had been taking low dose Seroquel for hallucinations, this was stopped in the hospital, as well as Tramadol. Her daughter deny any hallucinations off the Seroquel, but she is still having a lot of daytime drowsiness and not sleeping at night. The patient initially denied any focal weakness, then with prompting from daughters, noted her right side was weaker. Her daughter reports her left foot is turned out when walking but her right leg  seems weaker. She continues on Aricept 10mg  daily and Namenda 10mg  BID without side effects.  HPI 11/11/2015: This is a pleasant 83 yo RH woman with a history of chronic back pain, lymphoma, hypertension,with worsening memory and hallucinations. She lives alone, but her daughter just lives behind her house and sees her daily. Her daughter started noticing memory changes around 6 months ago, she was getting more forgetful, forgetting to pay her bills a couple of times. She misplaces things frequently and asks her daughter to help look for them quite often. She repeats herself. She started having hallucinations around 3 months ago, one time she saw and heard young people playing outside her window. Another time, she thought she saw someone trying to get into her car. She saw 2 men coming up the hallway in her house one time then disappearing. One time she woke up and saw a large rat at the foot of her bed, this episode was attributed to taking 2 different unrecalled medications for sleep. She has been taking Flexeril and Tramadol for close to a year and needs this for her back pain. Her daughter expressed concern that she has become less careful with her money, she has given up to $300 to different organizations. One time, she gave her social security number to someone on the phone, which she would have never done in the past. Her daughter fills out her pillbox, she is pretty good with remembering to take them. She drives short distances without getting lost.   She denies any significant head injuries or  alcohol intake. No family history of dementia.  She had an MRI brain without contrast done 08/27/15, images unavailable for review, per report, global atrophy without hydrocephalus, moderate small vessel disease type changes, no acute changes.  PAST MEDICAL HISTORY: Past Medical History:  Diagnosis Date  . Arthritis   . Cancer [facility])    lymphoma right leg Dr Earl Gala  . Diverticulitis   .  Dysrhythmia    PAF (converted to SR on metoprolol); saw Dr. Shirlee More 11/2013 with PRN cardioloy recommended  . H/O hiatal hernia   . Hypertension   . Shortness of breath    with exertion, get dizzy with deep breathing    MEDICATIONS:  Outpatient Encounter Prescriptions as of 05/24/2017  Medication Sig Note  . apixaban (ELIQUIS) 2.5 MG TABS tablet Take 2.5 mg by mouth. 07/29/2016: Received from: [place]: Take 2.5 mg by mouth Two (2) times a day.  Marland Kitchen aspirin 81 MG tablet Take 81 mg by mouth daily.   Marland Kitchen CALCIUM-VITAMIN D PO Take by mouth.   . cyclobenzaprine (FLEXERIL) 5 MG tablet Take 5 mg by mouth at bedtime as needed for muscle spasms.   Marland Kitchen dicyclomine (BENTYL) 10 MG capsule 10 mg. Take 1 capsule 3 times daily 11/11/2015: Received from: External Pharmacy Received Sig:   . donepezil (ARICEPT) 10 MG tablet Take 1 tablet (10 mg total) by mouth at bedtime.   . memantine (NAMENDA) 10 MG tablet Take 1 tablet (10 mg total) by mouth 2 (two) times daily.   . metoprolol tartrate (LOPRESSOR) 25 MG tablet Take 12.5 mg by mouth 2 (two) times daily.   . QUEtiapine (SEROQUEL) 25 MG tablet Take 1/2 tablet at night   . traMADol (ULTRAM) 50 MG tablet Take 50 mg by mouth every 4 (four) hours as needed (pain).    No facility-administered encounter medications on file as of 05/24/2017.     ALLERGIES: Allergies  Allergen Reactions  . Penicillins Anaphylaxis  . Aspirin Other (See Comments)    "makes me jittery"    FAMILY HISTORY: No family history on file.  SOCIAL HISTORY: Social History   Social History  . Marital status: Widowed    Spouse name: N/A  . Number of children: 5  . Years of education: N/A   Occupational History  . Not on file.   Social History Main Topics  . Smoking status: Former Smoker    Types: Cigarettes    Quit date: 08/14/1955  . Smokeless tobacco: Never Used  . Alcohol use No  . Drug use: No  . Sexual activity: Not on file   Other Topics Concern    . Not on file   Social History Narrative  . No narrative on file    REVIEW OF SYSTEMS: Constitutional: No fevers, chills, or sweats, no generalized fatigue, change in appetite Eyes: No visual changes, double vision, eye pain Ear, nose and throat: No hearing loss, ear pain, nasal congestion, sore throat Cardiovascular: No chest pain, palpitations Respiratory:  No shortness of breath at rest or with exertion, wheezes GastrointestinaI: No nausea, vomiting, diarrhea, abdominal pain, fecal incontinence Genitourinary:  No dysuria, urinary retention or frequency Musculoskeletal:  No neck pain, +back pain Integumentary: No rash, pruritus, skin lesions Neurological: as above Psychiatric: No depression, insomnia, anxiety Endocrine: No palpitations, fatigue, diaphoresis, mood swings, change in appetite, change in weight, increased thirst Hematologic/Lymphatic:  No anemia, purpura, petechiae. Allergic/Immunologic: no itchy/runny eyes, nasal congestion, recent allergic reactions, rashes  PHYSICAL EXAM: Vitals:  05/24/17 1008  BP: (!) 104/58  Pulse: 72  Resp: 16   General: No acute distress, follows commands, hard of hearing, sitting on wheelchair Head:  Normocephalic/atraumatic Neck: supple, no paraspinal tenderness, full range of motion Heart:  Regular rate and rhythm Lungs:  Clear to auscultation bilaterally Back: No paraspinal tenderness Skin/Extremities: No rash, no edema Neurological Exam: She can follow simple commands, unable to do MMSE due to hearing loss. Cranial nerves: Pupils equal, round, reactive to light.  Extraocular movements intact with no nystagmus. Visual fields full. Facial sensation intact. No facial asymmetry. Tongue, uvula, palate midline.  Motor: +cogwheeling on right (new). Muscle strength 4/5 right elbow flexion (?effort), 4/5 on left UE with decreased fine finger movements, 3/5 right hip flexion, 5/5 left LE. Seems to have a left pronator drift.  Sensation to  light touch intact.  No extinction to double simultaneous stimulation.  Deep tendon reflexes +1 throughout, toes downgoing.  Finger to nose testing intact.  Gait not tested, needs walker to ambulate. +right hand resting tremor, no postural or action tremor seen.  IMPRESSION: This is a pleasant 83 yo RH woman with a history of hypertension, lymphoma, chronic back pain,who presented with worsening memory loss and hallucinations, suggestive of Lewy Body dementia. She now has signs of parkinsonism with right hand resting tremor. She was in the hospital recently for worsening mental status, and was told there is a "lesion in the right, either stroke or tumor." Records and disc from [facility] will be requested for review, she will likely need follow-up imaging to assess for interval change. Her exam does show new changes of right cogwheeling, right hand resting tremor, as well as weakness on the left upper extremity and right lower extremity. Review of imaging would be very helpful to further clarify this distribution of weakness. Continue home PT. Her family is asking about a lift to help with transfers, they will confer with PT as to which lift is recommended and prescription will be sent. Seroquel for hallucinations was stopped in the hospital, family denies any hallucinations currently, hold off for now. Continue Aricept 10mg  daily and Namenda 10mg  BID. We again discussed need for higher level of care, family is starting to think about this more. She will follow-up in 4-5 months and knows to call for any changes.  Thank you for allowing me to participate in her care.  Please do not hesitate to call for any questions or concerns.  The duration of this appointment visit was 25 minutes of face-to-face time with the patient.  Greater than 50% of this time was spent in counseling, explanation of diagnosis, planning of further management, and coordination of care.   Ellouise Newer, M.D.   CC: Dr.  Tobie Poet

## 2017-05-24 NOTE — Patient Instructions (Addendum)
1. Records from [facility] will be requested for review 2. We will order repeat brain scan depending on what the report shows 3. Let us know which lift chair is recommended by the physical therapist 4. Follow-up in 4 months, call for any changes

## 2017-05-26 ENCOUNTER — Telehealth: Payer: Self-pay | Admitting: Neurology

## 2017-05-26 NOTE — Telephone Encounter (Signed)
Pls let her know I have not yet received the disc, but the report sounds more like stroke. They could not rule out tumor because contrast was not given. Recommend we do MRI brain with and without contrast to help further see what this lesion is. Thanks

## 2017-05-26 NOTE — Telephone Encounter (Signed)
Paperwork is on Dr. Amparo Bristol desk waiting for review.

## 2017-05-26 NOTE — Telephone Encounter (Signed)
Patient daughter states that we were to call her back when we got the records from [facility] and she still has not heard anything from [place]. So she is calling to see if we got the records and if someone would call her back

## 2017-05-26 NOTE — Telephone Encounter (Signed)
LMOM for [name] asking her to return my call to relay message below.

## 2017-05-27 ENCOUNTER — Other Ambulatory Visit: Payer: Self-pay

## 2017-05-27 ENCOUNTER — Telehealth: Payer: Self-pay | Admitting: Neurology

## 2017-05-27 ENCOUNTER — Telehealth: Payer: Self-pay

## 2017-05-27 DIAGNOSIS — F0390 Unspecified dementia without behavioral disturbance: Secondary | ICD-10-CM

## 2017-05-27 DIAGNOSIS — F03A0 Unspecified dementia, mild, without behavioral disturbance, psychotic disturbance, mood disturbance, and anxiety: Secondary | ICD-10-CM

## 2017-05-27 NOTE — Telephone Encounter (Signed)
Orders placed.  [name] will get a call from [facility] imaging for scheduling.

## 2017-05-27 NOTE — Telephone Encounter (Signed)
I left a VM for [name] explaining MRI results, and would get MRI brain with and without contrast scheduled within the next few weeks. Explained the left-sided symptoms she has correlates with the changes on the right side of brain on MRI. Pls go ahead and schedule scan and let her know. Thanks

## 2017-05-27 NOTE — Telephone Encounter (Signed)
Meagen, pls call her, I think this is for you, thanks

## 2017-05-27 NOTE — Telephone Encounter (Signed)
Daughter returning Dr. Amparo Bristol call.  She did not hear her phone yesterday evening when you call.  Please call her back and she will keep her phone out so she can hear it today.

## 2017-05-27 NOTE — Telephone Encounter (Signed)
Was on the phone with pt daughter, [name], when message was sent.  See other telephone encounter.

## 2017-05-27 NOTE — Telephone Encounter (Signed)
ERROR

## 2017-05-27 NOTE — Telephone Encounter (Addendum)
Spoke with [name].  Relayed message below.  She was wondering when pt should have MRI that Dr. Delice Lesch had suggested during her visit.  LOV notes state after reviewing records from [place].    [name] stated pt's left hand is shaky and "floppy" in the mornings when pt tried to grab or pick something up.  Usually gets better as the day goes on.  Also states that pt's left side of mouth droops in the AM and also resolves during the day.  [name] also stated when pt had was hospitalized the doctors there had made a comment of "If she had a stroke, it was not a recent one".

## 2017-05-31 ENCOUNTER — Telehealth: Payer: Self-pay | Admitting: Emergency Medicine

## 2017-05-31 NOTE — Telephone Encounter (Signed)
When is she scheduled for the repeat MRI brain with and without contrast? The daytime drowsiness may or may not be normal, if she has a urinary tract infection, it can cause similar symptoms. Recommend seeing PCP for this, and doing brain scan sooner than later. Thanks

## 2017-05-31 NOTE — Telephone Encounter (Signed)
Pts daughter called asking if Dr.Aquino has received a disc of a scan from [place] [facility]?  Also would like to speak to her about some problems with her mother.

## 2017-05-31 NOTE — Telephone Encounter (Signed)
Spoke with pt daughter, [name], letting her know that we have not received the disc from [facility] yet.  She states that pt cannot stay awake, [name] wakes her throughout the day.  Pt fell asleep while drinking her coffee this AM.  [name] states that pt is "going downhill and quick".  [name] is concerned - wanting to know if this is all normal or not.

## 2017-06-01 ENCOUNTER — Telehealth: Payer: Self-pay | Admitting: Neurology

## 2017-06-01 NOTE — Telephone Encounter (Signed)
[name] called again this morning and said she left a message yesterday but did not get a call back regarding concerns she has about her mother

## 2017-06-01 NOTE — Telephone Encounter (Addendum)
Spoke with pt's daughter, [name], relaying Dr. Amparo Bristol previous message about MRI and having pt checked for urinary tract infection.  [name] states that pt was just seen in the hospital a few weeks ago so she must have been checked for UTI then.  But was agreeable to taking pt to PCP for possible repeat UA.

## 2017-06-08 DIAGNOSIS — R2689 Other abnormalities of gait and mobility: Secondary | ICD-10-CM | POA: Diagnosis not present

## 2017-06-08 DIAGNOSIS — W19XXXD Unspecified fall, subsequent encounter: Secondary | ICD-10-CM | POA: Diagnosis not present

## 2017-06-08 DIAGNOSIS — F0390 Unspecified dementia without behavioral disturbance: Secondary | ICD-10-CM | POA: Diagnosis not present

## 2017-06-08 DIAGNOSIS — W19XXXA Unspecified fall, initial encounter: Secondary | ICD-10-CM | POA: Diagnosis not present

## 2017-06-08 DIAGNOSIS — N390 Urinary tract infection, site not specified: Secondary | ICD-10-CM | POA: Diagnosis not present

## 2017-06-08 DIAGNOSIS — Z7982 Long term (current) use of aspirin: Secondary | ICD-10-CM | POA: Diagnosis not present

## 2017-06-08 DIAGNOSIS — Z7902 Long term (current) use of antithrombotics/antiplatelets: Secondary | ICD-10-CM | POA: Diagnosis not present

## 2017-06-08 DIAGNOSIS — M1990 Unspecified osteoarthritis, unspecified site: Secondary | ICD-10-CM | POA: Diagnosis not present

## 2017-06-08 DIAGNOSIS — Z9181 History of falling: Secondary | ICD-10-CM | POA: Diagnosis not present

## 2017-06-08 DIAGNOSIS — Z8572 Personal history of non-Hodgkin lymphomas: Secondary | ICD-10-CM | POA: Diagnosis not present

## 2017-06-08 DIAGNOSIS — M6281 Muscle weakness (generalized): Secondary | ICD-10-CM | POA: Diagnosis not present

## 2017-06-08 DIAGNOSIS — G9340 Encephalopathy, unspecified: Secondary | ICD-10-CM | POA: Diagnosis not present

## 2017-06-14 ENCOUNTER — Other Ambulatory Visit: Payer: Self-pay

## 2017-06-14 ENCOUNTER — Telehealth: Payer: Self-pay | Admitting: Neurology

## 2017-06-14 DIAGNOSIS — R531 Weakness: Secondary | ICD-10-CM | POA: Diagnosis not present

## 2017-06-14 DIAGNOSIS — F03A0 Unspecified dementia, mild, without behavioral disturbance, psychotic disturbance, mood disturbance, and anxiety: Secondary | ICD-10-CM

## 2017-06-14 DIAGNOSIS — N3000 Acute cystitis without hematuria: Secondary | ICD-10-CM | POA: Diagnosis not present

## 2017-06-14 DIAGNOSIS — F0390 Unspecified dementia without behavioral disturbance: Secondary | ICD-10-CM

## 2017-06-14 DIAGNOSIS — G301 Alzheimer's disease with late onset: Secondary | ICD-10-CM | POA: Diagnosis not present

## 2017-06-14 DIAGNOSIS — R5383 Other fatigue: Secondary | ICD-10-CM | POA: Diagnosis not present

## 2017-06-14 NOTE — Telephone Encounter (Signed)
New order placed.  [facility] who state that they cannot schedule until the MRI has been authorized.  Sent message to [facility] for authorization.

## 2017-06-14 NOTE — Telephone Encounter (Signed)
Natha's daughter [name]) called regarding her MRI that is scheduled for 06/16/17 at [place]. She is not doing well and would like to see if it can be moved to [facility] instead? Please call. Thanks

## 2017-06-16 ENCOUNTER — Other Ambulatory Visit: Payer: PPO

## 2017-06-16 ENCOUNTER — Other Ambulatory Visit: Payer: Self-pay

## 2017-06-16 DIAGNOSIS — I10 Essential (primary) hypertension: Secondary | ICD-10-CM | POA: Insufficient documentation

## 2017-06-16 DIAGNOSIS — F03A0 Unspecified dementia, mild, without behavioral disturbance, psychotic disturbance, mood disturbance, and anxiety: Secondary | ICD-10-CM

## 2017-06-16 DIAGNOSIS — F0390 Unspecified dementia without behavioral disturbance: Secondary | ICD-10-CM

## 2017-06-16 NOTE — Telephone Encounter (Signed)
Called [facility], scheduled appointment for 7/20 @ 1:30PM.  Pt required to have labs drawn prior to MRI at [facility].  Lab appointment scheduled for noon.   Spoke with pt's daughter, [name], relaying message.  [name] states that pt had lab-work done on Monday per her PCP.  She is unaware if BUN+Creatinine were drawn.  Advised her to speak with PCP, if BUN+Creatinine were drawn, she should call [facility] to see if they will accept the results.

## 2017-06-18 ENCOUNTER — Telehealth: Payer: Self-pay | Admitting: Neurology

## 2017-06-18 DIAGNOSIS — F0390 Unspecified dementia without behavioral disturbance: Secondary | ICD-10-CM | POA: Diagnosis not present

## 2017-06-18 DIAGNOSIS — R930 Abnormal findings on diagnostic imaging of skull and head, not elsewhere classified: Secondary | ICD-10-CM | POA: Diagnosis not present

## 2017-06-18 DIAGNOSIS — R9089 Other abnormal findings on diagnostic imaging of central nervous system: Secondary | ICD-10-CM | POA: Diagnosis not present

## 2017-06-18 NOTE — Telephone Encounter (Signed)
MRI brain shows progression of masslike T2 hyperintensity throughout the right basal ganglia and thalamus with solid enhancement. There is mild extension inferiorly into the right cerebral peduncle. There is also new abnormal signal and enhancement extending across the splenium of the corpus callosum bilaterally, greater on the right. There is persistent restricted diffusion throughout the right-sided deep gray nuclei and now throughout the splenium of the corpus callosum consistent with high cellularity. A small amount of nodular enhancement is seen in the white matter posterior to the rigth lentiform nucleus and in the posterior right corona radiata. There is also a 1cm focus of abnormal diffusion and enhancement in the left frontoparietal subcortical white matter at the level of the centrum semiovale. Findings most consistent with lymphoma.  Called daughter [name]'s number, no answer, left VM. Will try again. She will need to see her oncologist in [facility].

## 2017-06-18 NOTE — Telephone Encounter (Signed)
Spoke to [name] re: MRI results, discussed options, she is very hesitant to go back to Oncology, she does not want chemo anymore. We discussed that there may be other treatment options. Another option is hospice, which is how [name] feels is a better option for her. She is unable to do much now, needs hoyer lift for transfers. Discussed stopping dementia medications. [name] will call on Monday regarding family decision.

## 2017-06-21 ENCOUNTER — Telehealth: Payer: Self-pay | Admitting: Neurology

## 2017-06-21 NOTE — Telephone Encounter (Signed)
PT's daughter [name] called in regards to PT's MRI and needs a call back

## 2017-06-21 NOTE — Telephone Encounter (Signed)
I called [name] and she said that her family wanted to know if Dr. Delice Lesch could tell them how long her mother has to live.  I told her that Dr. Delice Lesch would not answer that because she really doesn't know that answer either.  [name] agreed with me and said "her mom would die when God was ready to take her".  She will relay this to family.

## 2017-06-24 DIAGNOSIS — W19XXXD Unspecified fall, subsequent encounter: Secondary | ICD-10-CM | POA: Diagnosis not present

## 2017-06-24 DIAGNOSIS — M6281 Muscle weakness (generalized): Secondary | ICD-10-CM | POA: Diagnosis not present

## 2017-06-24 DIAGNOSIS — F0390 Unspecified dementia without behavioral disturbance: Secondary | ICD-10-CM | POA: Diagnosis not present

## 2017-06-24 DIAGNOSIS — R2689 Other abnormalities of gait and mobility: Secondary | ICD-10-CM | POA: Diagnosis not present

## 2017-06-28 DIAGNOSIS — N390 Urinary tract infection, site not specified: Secondary | ICD-10-CM | POA: Diagnosis not present

## 2017-07-29 ENCOUNTER — Ambulatory Visit: Payer: Self-pay | Admitting: Neurology

## 2017-07-31 DEATH — deceased

## 2017-09-24 ENCOUNTER — Ambulatory Visit: Payer: PPO | Admitting: Neurology

## 2017-10-12 ENCOUNTER — Ambulatory Visit: Payer: PPO | Admitting: Neurology
